# Patient Record
Sex: Male | Born: 1985 | Race: Asian | Hispanic: No | Marital: Married | Smoking: Former smoker
Health system: Southern US, Community
[De-identification: ages and names within clinical notes are randomized; demographics above are authoritative.]

## PROBLEM LIST (undated history)

## (undated) DIAGNOSIS — R42 Dizziness and giddiness: Secondary | ICD-10-CM

## (undated) DIAGNOSIS — G4730 Sleep apnea, unspecified: Secondary | ICD-10-CM

## (undated) DIAGNOSIS — F410 Panic disorder [episodic paroxysmal anxiety] without agoraphobia: Secondary | ICD-10-CM

---

## 2007-01-04 ENCOUNTER — Emergency Department (HOSPITAL_COMMUNITY): Admission: EM | Admit: 2007-01-04 | Discharge: 2007-01-04 | Payer: Self-pay | Admitting: Emergency Medicine

## 2014-12-22 ENCOUNTER — Emergency Department (HOSPITAL_COMMUNITY)
Admission: EM | Admit: 2014-12-22 | Discharge: 2014-12-22 | Disposition: A | Discharge status: Home / Self Care | Payer: 59 | Attending: Emergency Medicine | Admitting: Emergency Medicine

## 2014-12-22 ENCOUNTER — Encounter (HOSPITAL_COMMUNITY): Payer: Self-pay | Admitting: Emergency Medicine

## 2014-12-22 ENCOUNTER — Emergency Department (HOSPITAL_COMMUNITY): Payer: 59

## 2014-12-22 DIAGNOSIS — X58XXXA Exposure to other specified factors, initial encounter: Secondary | ICD-10-CM | POA: Insufficient documentation

## 2014-12-22 DIAGNOSIS — S39012A Strain of muscle, fascia and tendon of lower back, initial encounter: Secondary | ICD-10-CM | POA: Diagnosis not present

## 2014-12-22 DIAGNOSIS — Y9389 Activity, other specified: Secondary | ICD-10-CM | POA: Insufficient documentation

## 2014-12-22 DIAGNOSIS — Y998 Other external cause status: Secondary | ICD-10-CM | POA: Diagnosis not present

## 2014-12-22 DIAGNOSIS — S3992XA Unspecified injury of lower back, initial encounter: Secondary | ICD-10-CM | POA: Diagnosis present

## 2014-12-22 DIAGNOSIS — M549 Dorsalgia, unspecified: Secondary | ICD-10-CM

## 2014-12-22 DIAGNOSIS — Y9289 Other specified places as the place of occurrence of the external cause: Secondary | ICD-10-CM | POA: Insufficient documentation

## 2014-12-22 LAB — URINALYSIS, ROUTINE W REFLEX MICROSCOPIC
BILIRUBIN URINE: NEGATIVE
Glucose, UA: NEGATIVE mg/dL
Hgb urine dipstick: NEGATIVE
Ketones, ur: NEGATIVE mg/dL
Leukocytes, UA: NEGATIVE
Nitrite: NEGATIVE
PROTEIN: NEGATIVE mg/dL
Specific Gravity, Urine: 1.009 (ref 1.005–1.030)
Urobilinogen, UA: 0.2 mg/dL (ref 0.0–1.0)
pH: 7 (ref 5.0–8.0)

## 2014-12-22 MED ORDER — CYCLOBENZAPRINE HCL 10 MG PO TABS: 5.0000 mg | ORAL_TABLET | Freq: Every day | ORAL | Status: DC

## 2014-12-22 MED ORDER — HYDROCODONE-ACETAMINOPHEN 5-325 MG PO TABS
1.0000 | ORAL_TABLET | Freq: Once | ORAL | Status: AC
  Administered 2014-12-22: 1 via ORAL
  Filled 2014-12-22: qty 1

## 2014-12-22 NOTE — ED Provider Notes (Signed)
CSN: [id number]     Arrival date & time 12/22/14  1435 History  This chart was scribed for non-physician practitioner Raymon MuttonMarissa Titiana Severa, PA, working with Tilden FossaElizabeth Rees, MD, by Tanda RockersMargaux Venter, ED Scribe. This patient was seen in room WTR8/WTR8 and the patient's care was started at 3:13 PM.     Chief Complaint  Patient presents with  . Back Pain   The history is provided by the patient. No language interpreter was used.     HPI Comments: Kyle Robertson is a 29 y.o. male brought in by ambulance, with no pertinent past medical history who presents to the Emergency Department complaining of gradual onset, constant, lower back pain that began Saturday, 12/17/2014 (5 days ago), worsening today. Pt mentions that the back pain began shortly after cleaning his house on Saturday. The pain was gradually improving during the week, but was exacerbated today after moving a small chair. Pt states that he was unable to ambulate due to the pain, prompting him to call EMS. The pain is a 10/10 with movement and is about a 5/10 with rest. Pt did take an 800 mg Ibuprofen at approximately 1:15 PM today (2 hours ago) and states that his pain is improving. He does report mild weakness to his right leg which exacerbates the back pain upon movement. Pt denies pain radiation. No recent fall, injury, or trauma to the back. Pt has no hx of back issues. No IV drug abuse or illicit drug use in the past. Denies fever, chills, numbness or tingling to extremities, abdominal pain, nausea, vomiting, diarrhea, urinary or bowel incontinence, recent travels, loss of sensation, chest pain, shortness of breath, difficulty breathing, or any other symptoms.  PCP none   History reviewed. No pertinent past medical history. History reviewed. No pertinent past surgical history. No family history on file. History  Substance Use Topics  . Smoking status: Never Smoker   . Smokeless tobacco: Not on file  . Alcohol Use: Yes    Review of Systems   Constitutional: Negative for fever and chills.  Respiratory: Negative for shortness of breath.   Cardiovascular: Negative for chest pain.  Gastrointestinal: Negative for nausea, vomiting and abdominal pain.       Negative for bowel incontinence.   Genitourinary:       Negative for urinary incontinence.   Musculoskeletal: Positive for back pain (Lumbar pain. ). Negative for arthralgias.  Neurological: Positive for weakness (Right leg weakness. ). Negative for numbness.      Allergies  Sudafed  Home Medications   Prior to Admission medications   Medication Sig Start Date End Date Taking? Authorizing Provider  cyclobenzaprine (FLEXERIL) 10 MG tablet Take 0.5 tablets (5 mg total) by mouth at bedtime. 12/22/14   Carlitos Bottino, PA-C   Triage Vitals: BP 152/75 mmHg  Pulse 65  Temp(Src) 97.6 F (36.4 C) (Oral)  Resp 16  SpO2 100%   Physical Exam  Constitutional: He is oriented to person, place, and time. He appears well-developed and well-nourished. No distress.  HENT:  Head: Normocephalic and atraumatic.  Mouth/Throat: Oropharynx is clear and moist.  Eyes: Conjunctivae and EOM are normal. Pupils are equal, round, and reactive to light. Right eye exhibits no discharge. Left eye exhibits no discharge.  Neck: Normal range of motion. Neck supple. No tracheal deviation present.  Negative pain upon palpation to the c-spine.   Cardiovascular: Normal rate, regular rhythm and normal heart sounds.   Pulses:      Radial pulses are 2+ on the  right side, and 2+ on the left side.       Dorsalis pedis pulses are 2+ on the right side, and 2+ on the left side.  Pulmonary/Chest: Effort normal and breath sounds normal. No respiratory distress. He has no wheezes. He has no rales.  Abdominal: Soft. Bowel sounds are normal. He exhibits no distension. There is no tenderness. There is no rebound, no guarding and no CVA tenderness.  Musculoskeletal: He exhibits tenderness. He exhibits no edema.        Lumbar back: He exhibits tenderness. He exhibits normal range of motion, no bony tenderness, no swelling, no edema, no deformity and no laceration.       Back:  Full ROM to upper and lower extremities without difficulty noted, negative ataxia noted. Mild decreased range of motion to lower extremities secondary to pain in the lumbar spine. Negative deformities identified to the spine-discomfort upon palpation to the spinous and paravertebral regions of the lumbar spine bilaterally.  Lymphadenopathy:    He has no cervical adenopathy.  Neurological: He is alert and oriented to person, place, and time. No cranial nerve deficit. He exhibits normal muscle tone. Coordination normal.  Cranial nerves III-XII grossly intact Strength 5+/5+ to upper and lower extremities bilaterally with resistance applied, equal distribution noted Equal grip strength bilaterally Negative saddle paresthesias bilaterally Sensation intact with differentiation sharp and dull touch Negative arm drift  Skin: Skin is warm and dry. No rash noted. He is not diaphoretic. No erythema.  Psychiatric: He has a normal mood and affect. His behavior is normal. Thought content normal.  Nursing note and vitals reviewed.   ED Course  Procedures (including critical care time)  DIAGNOSTIC STUDIES: Oxygen Saturation is 100% on RA, normal by my interpretation.    COORDINATION OF CARE: 3:22 PM-Discussed treatment plan which includes UA with pt at bedside and pt agreed to plan.   Labs Review Labs Reviewed  URINALYSIS, ROUTINE W REFLEX MICROSCOPIC    Imaging Review Dg Lumbar Spine Complete  12/22/2014   CLINICAL DATA:  Woke up this a.m. with low back pain, right leg pain  EXAM: LUMBAR SPINE - COMPLETE 4+ VIEW  COMPARISON:  None.  FINDINGS: Five views of lumbar spine submitted. No acute fracture or subluxation. Alignment, disc spaces and vertebral body heights are preserved.  IMPRESSION: Negative.   Electronically Signed   By: Natasha Mead M.D.   On: 12/22/2014 16:41     EKG Interpretation None      MDM   Final diagnoses:  Back pain  Lumbar strain, initial encounter    Medications  HYDROcodone-acetaminophen (NORCO/VICODIN) 5-325 MG per tablet 1 tablet (1 tablet Oral Given 12/22/14 1554)    Filed Vitals:   12/22/14 1441 12/22/14 1727  BP: 152/75 150/70  Pulse: 65 65  Temp: 97.6 F (36.4 C)   TempSrc: Oral   Resp: 16 18  SpO2: 100% 99%   I personally performed the services described in this documentation, which was scribed in my presence. The recorded information has been reviewed and is accurate.  Urinalysis unremarkable-negative hemoglobin, nitrites, leukocytes. Lumbar spine negative. Negative focal neurological deficits noted. Pulses palpable and strong. Tenderness upon palpation to lumbar spine, suspicion to be muscular nature secondary to pain upon palpation and pain with motion. Imaging unremarkable. Gait proper-negative step-offs or sway. Negative findings of UTI or pyelonephritis. Negative CVA tenderness. Abdominal exam benign-doubt acute abdominal processes. Pain medications given in ED setting with relief. Doubt cauda equina. Doubt epidural abscess. Suspicion to be  muscular pain/strain of the lumbar spine. Patient stable, afebrile. Patient not septic appearing. Discharged patient. Discharged patient with muscle relaxer-discussed course, precautions, disposal technique. Discussed with patient to avoid any physical or strenuous activity. Discussed with patient to apply heat and massage with icy hot ointment. Discussed with patient to avoid any physical or heavy lifting. [facility] and [facility]opedics. Discussed with patient to closely monitor symptoms and if symptoms are to worsen or change to report back to the ED - strict return instructions given.  Patient agreed to plan of care, understood, all questions answered.   Raymon MuttonMarissa Germaine Ripp, PA-C 12/22/14 1748  Tilden FossaElizabeth Rees,  MD 12/23/14 [phone]

## 2014-12-22 NOTE — ED Notes (Signed)
Per EMS, Pt c/o back pain since this past Saturday. Pt attempted to move a chair a couple of hours ago and pain became severe. Pt took tylenol with no relief. Pt denies original injury on Saturday. A&Ox4.

## 2014-12-22 NOTE — Discharge Instructions (Signed)
Please call your doctor for a followup appointment within 24-48 hours. When you talk to your doctor please let them know that you were seen in the emergency department and have them acquire all of your records so that they can discuss the findings with you and formulate a treatment plan to fully care for your new and ongoing problems. Please follow-up with orthopedics Please rest and stay hydrated Please take medication as prescribed. While on muscle relaxer there is to be no drinking alcohol, driving, operating any machinery. While on muscle relaxers this can lead to drowsiness. Please rest and stay hydrated avoid any physical or strenuous activity Please apply heat and massage with icy hot ointment Please continue to monitor symptoms closely and if symptoms are to worsen or change (fever greater than 101, chills, sweating, nausea, vomiting, chest pain, shortness of breathe, difficulty breathing, weakness, numbness, tingling, worsening or changes to pain pattern, fall, injury, inability to control urine or bowel movements, blood in the urine, pain with urination, loss of sensation to legs, drinking of the feet, weakness to the legs, inability to apply pressure) please report back to the Emergency Department immediately.   Lumbosacral Strain Lumbosacral strain is a strain of any of the parts that make up your lumbosacral vertebrae. Your lumbosacral vertebrae are the bones that make up the lower third of your backbone. Your lumbosacral vertebrae are held together by muscles and tough, fibrous tissue (ligaments).  CAUSES  A sudden blow to your back can cause lumbosacral strain. Also, anything that causes an excessive stretch of the muscles in the low back can cause this strain. This is typically seen when people exert themselves strenuously, fall, lift heavy objects, bend, or crouch repeatedly. RISK FACTORS  Physically demanding work.  Participation in pushing or pulling sports or sports that require a  sudden twist of the back (tennis, golf, baseball).  Weight lifting.  Excessive lower back curvature.  Forward-tilted pelvis.  Weak back or abdominal muscles or both.  Tight hamstrings. SIGNS AND SYMPTOMS  Lumbosacral strain may cause pain in the area of your injury or pain that moves (radiates) down your leg.  DIAGNOSIS Your health care provider can often diagnose lumbosacral strain through a physical exam. In some cases, you may need tests such as X-ray exams.  TREATMENT  Treatment for your lower back injury depends on many factors that your clinician will have to evaluate. However, most treatment will include the use of anti-inflammatory medicines. HOME CARE INSTRUCTIONS   Avoid hard physical activities (tennis, racquetball, waterskiing) if you are not in proper physical condition for it. This may aggravate or create problems.  If you have a back problem, avoid sports requiring sudden body movements. Swimming and walking are generally safer activities.  Maintain good posture.  Maintain a healthy weight.  For acute conditions, you may put ice on the injured area.  Put ice in a plastic bag.  Place a towel between your skin and the bag.  Leave the ice on for 20 minutes, 2-3 times a day.  When the low back starts healing, stretching and strengthening exercises may be recommended. SEEK MEDICAL CARE IF:  Your back pain is getting worse.  You experience severe back pain not relieved with medicines. SEEK IMMEDIATE MEDICAL CARE IF:   You have numbness, tingling, weakness, or problems with the use of your arms or legs.  There is a change in bowel or bladder control.  You have increasing pain in any area of the body, including your belly (  abdomen).  You notice shortness of breath, dizziness, or feel faint.  You feel sick to your stomach (nauseous), are throwing up (vomiting), or become sweaty.  You notice discoloration of your toes or legs, or your feet get very  cold. MAKE SURE YOU:   Understand these instructions.  Will watch your condition.  Will get help right away if you are not doing well or get worse. Document Released: 05/08/2005 Document Revised: 08/03/2013 Document Reviewed: 03/17/2013 [facility] Patient Information 2015 [place], [place]. This information is not intended to replace advice given to you by your health care provider. Make sure you discuss any questions you have with your health care provider.   Emergency Department Resource Guide 1) Find a Doctor and Pay Out of Pocket Although you won't have to find out who is covered by your insurance plan, it is a good idea to ask around and get recommendations. You will then need to call the office and see if the doctor you have chosen will accept you as a new patient and what types of options they offer for patients who are self-pay. Some doctors offer discounts or will set up payment plans for their patients who do not have insurance, but you will need to ask so you aren't surprised when you get to your appointment.  2) Contact Your Local Health Department Not all health departments have doctors that can see patients for sick visits, but many do, so it is worth a call to see if yours does. If you don't know where your local health department is, you can check in your phone book. The CDC also has a tool to help you locate your state's health department, and many state websites also have listings of all of their local health departments.  3) Find a Walk-in Clinic If your illness is not likely to be very severe or complicated, you may want to try a walk in clinic. These are popping up all over the country in pharmacies, drugstores, and shopping centers. They're usually staffed by nurse practitioners or physician assistants that have been trained to treat common illnesses and complaints. They're usually fairly quick and inexpensive. However, if you have serious medical issues or chronic medical  problems, these are probably not your best option.  No Primary Care Doctor: - Call Health Connect at  [phone] - they can help you locate a primary care doctor that  accepts your insurance, provides certain services, etc. - Physician Referral Service- [phone]  Chronic Pain Problems: Organization         Address  Phone   Notes  [facility]  [phone] Patients need to be referred by their primary care doctor.   Medication Assistance: Organization         Address  Phone   Notes  [facility] [address] [place], [place] [address] [phone] --Must be a resident of [facility] -- Must have NO insurance coverage whatsoever (no Medicaid/ Medicare, etc.) -- The pt. MUST have a primary care doctor that directs their care regularly and follows them in the community   [facility]  [phone]   [employer]  [phone]    Agencies that provide inexpensive medical care: Organization         Address  Phone   Notes  [facility]  [phone]   [facility]    [phone]   [facility] [address]  [address] [place], [place] [address] [phone]   [facility] [address] [place] [place] [phone]   Planned Parenthood    [phone]   [facility]    [phone]   [facility]  [address], [place] Phone:  [phone], Fax:  [phone] Hours of Operation:  9 am - 6 pm, M-F.  Also accepts Medicaid/Medicare and self-pay.  [facility]  [address], [place] Phone: [phone], Fax: [phone]. Hours of Operation:  8:30 am - 5:30 pm, M-F.  Also accepts Medicaid and self-pay.  [facility] [place] [address] [place] Phone: [phone]   [facility] [address] Natasha Bence [place], [place] [phone], Ext. [phone]  Mondays & Thursdays: 7-9 AM.  First 15 patients are seen on a first come, first serve basis.    Medicaid-accepting [facility] Providers:  Organization         Address  Phone   Notes  [facility] [address], [place] [phone] Also accepts self-pay patients.  [facility] [address] Laurell Josephs [place], [place]  [phone]   [facility] [address], [place] [phone]   [facility] [address] [place] [phone]   Renaye Rakers [address], [place]   [phone] Only accepts [place] patients after they have their name applied to their card.   Self-Pay (no insurance) in [facility]:  Organization         Address  Phone   Notes  Sickle Cell Patients, [facility] Internal Medicine [address] [place] [address] [place], [place] [phone]   [facility] [address] [place], [place] [phone]   [facility] [place]  [address], [place] [phone]   [facility]/Dr. Osei-Bonsu  [address], [place] or [address], [place] [phone] Phone number for both [place] and [place] locations is the same.  [facility]gent Medical and [facility] [address] [place] [phone]   [facility] [address], [place] or [address] [phone]   [facility] [address] [place] [phone], phone; [phone], fax Sees patients 1st and 3rd Saturday of every month.  Must not qualify for public or private insurance (i.e. Medicaid, Medicare, [place] Health Choice, Veterans' Benefits)  Household income should be no more than 200% of the poverty level The clinic cannot treat you if you are pregnant or think you are pregnant  Sexually transmitted diseases are not treated at  the clinic.    Dental Care: Organization         Address  Phone  Notes  [facility] Department of [facility] [address] [place], [place] [phone] Accepts children up to age 52 who are enrolled in [place] or [place] Health Choice; pregnant women with a Medicaid card; and children who have applied for Medicaid or [place] Health Choice, but were declined, whose parents can pay a reduced fee at time of service.  [facility] Department of [facility]  [address], [place] [phone] Accepts children up to age 59 who are enrolled in [place] or [place] Health  Choice; pregnant women with a Medicaid card; and children who have applied for Medicaid or [place] Health Choice, but were declined, whose parents can pay a reduced fee at time of service.  [facility]  [address], [place] [phone]) [phone] Patients are seen by appointment only. Walk-ins are not accepted. [facility] will see patients 29 years of age and older. Monday - Tuesday (8am-5pm) Most Wednesdays (8:30-5pm) $30 per visit, cash only  [facility]  [address], [facility] [place] [place] [phone] Patients are seen by appointment only. Walk-ins are not accepted. [facility] will see patients 29 years of age and older. One Wednesday Evening (Monthly: Volunteer Based).  $30 per visit, cash only  [employer] [facility] [facility]  [phone] for adults; Children under age 614, call Graduate Pediatric Dentistry at [phone]. Children aged 214-14, please call [phone] to request a pediatric application.  Dental services are provided in all areas of dental care including fillings, crowns and bridges, complete and partial dentures, implants, gum treatment, root canals, and extractions. Preventive care is also provided. Treatment is provided to both adults and children. Patients are selected via a lottery and there is often a  waiting list.   [facility] [address], [place]  [phone] [email or web address]   [facility] [address], [place], [place] [phone], Ext. [phone] Second and Fourth Thursday of each month, opens at 6:30 AM; Clinic ends at 9 AM.  Patients are seen on a first-come first-served basis, and a limited number are seen during each clinic.   [facility]  [address], [place], [place] [phone]   Eligibility Requirements You must have lived in [place], or [place] for at least the last three months.   You cannot be eligible for state or federal sponsored [place] Cityhealthcare insurance, including CIGNAVeterans Administration, [place]icaid, or [employer]icare.   You generally cannot be eligible for healthcare insurance through your employer.    How to apply: Eligibility screenings are held every Tuesday and Wednesday afternoon from 1:00 pm until 4:00 pm. You do not need an appointment for the interview!  [facility] [address], [place], [place] [phone]   [facility]  [phone]   [facility]  [phone]   [facility]  [phone]    Behavioral Health Resources in the Community: Intensive Outpatient Programs Organization         Address  Phone  Notes  [facility] [address], [place], [place] [phone]   [facility] [address], [place], [place] [phone]   [facility] [address], [place], [place]  [phone]   [facility] [address],  [place], [place] [phone] or [phone]   Substance Abuse Resources Organization         Address  Phone  Notes  [facility]  [phone]   [facility]  [phone]   [facility]  [phone]   [facility]  [phone]   Residential & Outpatient Substance Abuse  Program  [phone]   Psychological Services Organization         Address  Phone  Notes  [facility]  [phone]- [phone]   [facility]  [phone]- [phone]   [facility] [address], [place] [phone] or [phone]  Mobile Crisis Teams Organization         Address  Phone  Notes  [facility], Mobile Crisis Care Unit  [phone]   [facility]  [address] [place], [place] [phone]   Doristine LocksSharon DeEsch [address] [place] [phone]    Self-Help/Support Groups Organization         Address  Phone             Notes  [facility] [place] - variety of support groups  [phone]- [phone] Call for more information  Narcotics Anonymous (NA), [facility] [address], [place]  2 meetings at this location   Statisticianesidential Treatment Programs Organization         Address  Phone  Notes  [facility] [address],    [place]  [phone]   [facility]  [address], [place] [phone], [place], [place] [phone]   [facility] [address], [place] [phone] Admissions: 8am-3pm M-F  [facility] [address]    [place], [place] [phone]   [facility] [address], [place], [place] [phone]   [facility] [address]  [place], [place] [phone]   [facility] - Intensive Outpatient [address], [place], [place] [phone]   [facility] ([facility] [address]  [place], [place] [phone] or [phone]   [facility] ([facility]) [address] [place], [place] [phone] Accepts Medicaid  [facility] [address]  [place] [phone] Substance Abuse/Addiction Treatment   [facility] Resources Organization          Address  Phone  Notes  [facility]  [phone]) [phone]   Angie FavaJulie Brannon, PhD [address], [address] [place], [place]   [phone](336) [phone] or [phone]   [facility]   [address] [place], [place] [phone](336) [phone]   [facility] [address], [place], [place] [phone]) [phone] Insurance/Medicaid/sponsorship through [facility]  [facility] Families [address]                                    [place], [place] [phone](336) [phone] Therapy/tele-psych/case  [facility] [address].   [place], [place] [phone]) [phone]    Dr. Lolly MustacheArfeen  [phone]   [facility]  [employer] [facility] 1) [address], [place] 2) [address], [place] 3)  [address], [place] [phone]) [phone]) [phone]  [phone](336) [phone]   [facility] Child Abuse Hotline [phone](336) [phone] or [phone]) [phone] (After Hours)

## 2014-12-22 NOTE — ED Notes (Signed)
Pt ambulated out into hallway in back without assistance. Pt still having spasms but capable of ambulation.

## 2014-12-22 NOTE — ED Notes (Signed)
Pt sts that last Saturday he was cleaning and about an hur late rhis lower back began to hurt. Pt sts the pain had gotten better until today when he moved a chair. Pt denies numbness or tingling. Pt denies loss of bladder or bowels.

## 2015-11-04 ENCOUNTER — Emergency Department (HOSPITAL_COMMUNITY): Payer: Medicaid Other

## 2015-11-04 ENCOUNTER — Emergency Department (HOSPITAL_COMMUNITY)
Admission: EM | Admit: 2015-11-04 | Discharge: 2015-11-04 | Disposition: A | Discharge status: Home / Self Care | Payer: Medicaid Other | Attending: Emergency Medicine | Admitting: Emergency Medicine

## 2015-11-04 ENCOUNTER — Other Ambulatory Visit: Payer: Self-pay

## 2015-11-04 ENCOUNTER — Encounter (HOSPITAL_COMMUNITY): Payer: Self-pay

## 2015-11-04 DIAGNOSIS — R0600 Dyspnea, unspecified: Secondary | ICD-10-CM | POA: Diagnosis not present

## 2015-11-04 DIAGNOSIS — R079 Chest pain, unspecified: Secondary | ICD-10-CM | POA: Diagnosis present

## 2015-11-04 LAB — CBC
HEMATOCRIT: 43.4 % (ref 39.0–52.0)
HEMOGLOBIN: 14.8 g/dL (ref 13.0–17.0)
MCH: 28.7 pg (ref 26.0–34.0)
MCHC: 34.1 g/dL (ref 30.0–36.0)
MCV: 84.3 fL (ref 78.0–100.0)
Platelets: 269 10*3/uL (ref 150–400)
RBC: 5.15 MIL/uL (ref 4.22–5.81)
RDW: 12.3 % (ref 11.5–15.5)
WBC: 7 10*3/uL (ref 4.0–10.5)

## 2015-11-04 LAB — BASIC METABOLIC PANEL
ANION GAP: 10 (ref 5–15)
BUN: 10 mg/dL (ref 6–20)
CHLORIDE: 108 mmol/L (ref 101–111)
CO2: 21 mmol/L — ABNORMAL LOW (ref 22–32)
Calcium: 8.8 mg/dL — ABNORMAL LOW (ref 8.9–10.3)
Creatinine, Ser: 0.87 mg/dL (ref 0.61–1.24)
Glucose, Bld: 122 mg/dL — ABNORMAL HIGH (ref 65–99)
POTASSIUM: 4 mmol/L (ref 3.5–5.1)
SODIUM: 139 mmol/L (ref 135–145)

## 2015-11-04 LAB — I-STAT TROPONIN, ED: Troponin i, poc: 0 ng/mL (ref 0.00–0.08)

## 2015-11-04 MED ORDER — PREDNISONE 20 MG PO TABS: 40.0000 mg | ORAL_TABLET | Freq: Every day | ORAL | Status: DC

## 2015-11-04 MED ORDER — ALBUTEROL SULFATE HFA 108 (90 BASE) MCG/ACT IN AERS
2.0000 | INHALATION_SPRAY | RESPIRATORY_TRACT | Status: DC | PRN
  Administered 2015-11-04: 2 via RESPIRATORY_TRACT
  Filled 2015-11-04: qty 6.7

## 2015-11-04 MED ORDER — ASPIRIN 81 MG PO CHEW
324.0000 mg | CHEWABLE_TABLET | Freq: Once | ORAL | Status: AC
  Administered 2015-11-04: 324 mg via ORAL
  Filled 2015-11-04: qty 4

## 2015-11-04 MED ORDER — IPRATROPIUM-ALBUTEROL 0.5-2.5 (3) MG/3ML IN SOLN
3.0000 mL | Freq: Once | RESPIRATORY_TRACT | Status: AC
  Administered 2015-11-04: 3 mL via RESPIRATORY_TRACT
  Filled 2015-11-04: qty 3

## 2015-11-04 MED ORDER — PREDNISONE 20 MG PO TABS
60.0000 mg | ORAL_TABLET | Freq: Once | ORAL | Status: AC
  Administered 2015-11-04: 60 mg via ORAL
  Filled 2015-11-04: qty 3

## 2015-11-04 NOTE — Discharge Instructions (Signed)

## 2015-11-04 NOTE — ED Provider Notes (Signed)
CSN: [id number]     Arrival date & time 11/04/15  [id number] History   First MD Initiated Contact with Patient 11/04/15 [phone]     Chief Complaint  Patient presents with  . Chest Pain     (Consider location/radiation/quality/duration/timing/severity/associated sxs/prior Treatment) HPI   Kyle Robertson is a 30 year old male with history of intermittent back pain, otherwise healthy, presents to the emergency department for left sided sharp and tight chest pain associated with dyspnea that began last night and has been persistent since. He states that these pain and dyspnea both worsened with laying flat and with exertion.  Pain is located and has a left lower sternal border and anterior chest, described as tightness and rated 6 out of 10.  His pain is improved with sitting up straight and extending his neck out.  He denies wheeze, diaphoresis, nausea, vomiting, lightheadedness, palpitations, lower extremity edema. He states that 3 days ago he had flulike symptoms with nasal congestion, cough, sore throat, fatigue, chills and fever. 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  Last night when he laid down is when he began to feel short of breath, and was worse this morning when he woke up.   History reviewed. No pertinent past medical history. History reviewed. No pertinent past surgical history. No family history on file. Social History  Substance Use Topics  . Smoking status: Never Smoker   . Smokeless tobacco: None  . Alcohol Use: Yes    Review of Systems  All other systems reviewed and are negative.     Allergies  Sudafed  Home Medications   Prior to Admission medications   Medication Sig Start Date End Date Taking? Authorizing Provider  cyclobenzaprine (FLEXERIL) 10 MG tablet Take 0.5 tablets (5 mg total) by mouth at bedtime. Patient not taking: Reported on 11/04/2015 12/22/14   Marissa Sciacca, PA-C  predniSONE  (DELTASONE) 20 MG tablet Take 2 tablets (40 mg total) by mouth daily. Take 40 mg by mouth daily for 3 days, then 20mg  by mouth daily for 3 days, then 10mg  daily for 3 days 11/04/15   Danelle BerryLeisa Dacia Capers, PA-C   BP 108/88 mmHg  Pulse 72  Temp(Src) 97.9 F (36.6 C) (Oral)  Resp 15  Ht 5\' 6"  (1.676 m)  Wt 90.464 kg  BMI 32.21 kg/m2  SpO2 99% Physical Exam  Constitutional: He is oriented to person, place, and time. He appears well-developed and well-nourished. No distress.  HENT:  Head: Normocephalic and atraumatic.  Nose: Nose normal.  Mouth/Throat: Oropharynx is clear and moist. No oropharyngeal exudate.  Eyes: Conjunctivae and EOM are normal. Pupils are equal, round, and reactive to light. Right eye exhibits no discharge. Left eye exhibits no discharge. No scleral icterus.  Neck: Normal range of motion. Neck supple. No JVD present. No tracheal deviation present. No thyromegaly present.  Cardiovascular: Normal rate, regular rhythm, normal heart sounds and intact distal pulses.  Exam reveals no gallop and no friction rub.   No murmur heard. Regular rate and rhythm, no murmur, gallop or rub. Symmetrical radial pulses 2+ and dorsal pedis pulses 2+, no lower extremity edema.    Pulmonary/Chest: Breath sounds normal. No respiratory distress. He has no wheezes. He has no rales. He exhibits no tenderness.  CTA A&P, pt sitting erect with neck extended, with mild tachypnea, no retractions  Abdominal: Soft. Bowel sounds are normal. He exhibits no distension and no mass. There is no  tenderness. There is no rebound and no guarding.  Musculoskeletal: Normal range of motion. He exhibits no edema or tenderness.  Lymphadenopathy:    He has no cervical adenopathy.  Neurological: He is alert and oriented to person, place, and time. He has normal reflexes. No cranial nerve deficit. He exhibits normal muscle tone. Coordination normal.  Skin: Skin is warm and dry. No rash noted. He is not diaphoretic. No erythema. No  pallor.  Psychiatric: He has a normal mood and affect. His behavior is normal. Judgment and thought content normal.  Nursing note and vitals reviewed.   ED Course  Procedures (including critical care time) Labs Review Labs Reviewed  BASIC METABOLIC PANEL - Abnormal; Notable for the following:    CO2 21 (*)    Glucose, Bld 122 (*)    Calcium 8.8 (*)    All other components within normal limits  CBC  I-STAT TROPOININ, ED    Imaging Review Dg Chest 2 View  11/04/2015  CLINICAL DATA:  30 year old male with left-sided chest tightness and shortness of breath EXAM: CHEST  2 VIEW COMPARISON:  None. FINDINGS: The lungs are clear and negative for focal airspace consolidation, pulmonary edema or suspicious pulmonary nodule. No pleural effusion or pneumothorax. Cardiac and mediastinal contours are within normal limits. No acute fracture or lytic or blastic osseous lesions. The visualized upper abdominal bowel gas pattern is unremarkable. IMPRESSION: Normal chest x-ray. Electronically Signed   By: Malachy Moan M.D.   On: 11/04/2015 09:45   I have personally reviewed and evaluated these images and lab results as part of my medical decision-making.   EKG Interpretation   Date/Time:  Saturday November 04 2015 08:24:02 EDT Ventricular Rate:  82 PR Interval:  134 QRS Duration: 94 QT Interval:  372 QTC Calculation: 434 R Axis:   87 Text Interpretation:  Normal sinus rhythm ST elevation, consider early  repolarization Borderline ECG since last tracing no significant change  Confirmed by BELFI  MD, MELANIE ([id number]) on 11/04/2015 8:38:01 AM      MDM   30 year old male with chest pain and shortness of breath, recent URI/flulike symptoms He complained of exertional dyspnea and orthopnea with chest tightness and chest pain Is otherwise healthy, chest pain nonconcerning for ACS, Heart score of 1 Do not suspect PE, he is PERC negative With recent infection and positional chest pain I did consider  pericarditis or myocarditis, chest x-ray, EKG and troponin obtained, negative and reassuring  Patient also had basic blood work obtained which was unremarkable He noted significant improvement in his dyspnea with the DuoNeb, he appears more comfortable and his respiratory rate has improved.  He ambulated with normal heart rate and oxygenation without difficulty and without any dyspnea. Will discharge home with albuterol and short steroid taper.  I discussed return precautions with him. Work note was given.  I feel he is safe to discharge home and he agrees.  He was discharged in good condition, VSS.   Final diagnoses:  Dyspnea        Danelle Berry, PA-C 11/06/15 1610  Rolan Bucco, MD 11/11/15 [phone]

## 2015-11-04 NOTE — ED Notes (Signed)
Pt verbalized understanding of d/c instructions, prescriptions, and follow-up care. No further questions/concerns, VSS, ambulatory w/ steady gait (refused wheelchair) 

## 2015-11-04 NOTE — ED Notes (Signed)
Pt. Reports having flu-like symptoms ago, sore throat , fatique,  Chills and productive cough.  Pt. Also has nasal congestion.  He reports that the symptoms have  Cleared up but he has lt. Side chest tightness.  Non -radiating.  Denies any n/v/d.  ECG completed in Triage.

## 2015-11-04 NOTE — ED Notes (Signed)
Pt transported to xray 

## 2015-11-04 NOTE — ED Notes (Signed)
Pt SpO2 100% and hr 70bpm while ambulating in hallway.

## 2015-11-06 ENCOUNTER — Encounter (HOSPITAL_COMMUNITY): Payer: Self-pay | Admitting: Emergency Medicine

## 2015-11-06 ENCOUNTER — Emergency Department (HOSPITAL_COMMUNITY): Payer: Medicaid Other

## 2015-11-06 ENCOUNTER — Emergency Department (HOSPITAL_COMMUNITY)
Admission: EM | Admit: 2015-11-06 | Discharge: 2015-11-06 | Disposition: A | Discharge status: Home / Self Care | Payer: Medicaid Other | Attending: Emergency Medicine | Admitting: Emergency Medicine

## 2015-11-06 DIAGNOSIS — R51 Headache: Secondary | ICD-10-CM | POA: Diagnosis present

## 2015-11-06 DIAGNOSIS — R1110 Vomiting, unspecified: Secondary | ICD-10-CM | POA: Insufficient documentation

## 2015-11-06 DIAGNOSIS — F17200 Nicotine dependence, unspecified, uncomplicated: Secondary | ICD-10-CM | POA: Diagnosis not present

## 2015-11-06 DIAGNOSIS — R69 Illness, unspecified: Secondary | ICD-10-CM

## 2015-11-06 DIAGNOSIS — J0110 Acute frontal sinusitis, unspecified: Secondary | ICD-10-CM

## 2015-11-06 DIAGNOSIS — J111 Influenza due to unidentified influenza virus with other respiratory manifestations: Secondary | ICD-10-CM | POA: Diagnosis not present

## 2015-11-06 LAB — URINALYSIS, ROUTINE W REFLEX MICROSCOPIC
Bilirubin Urine: NEGATIVE
Glucose, UA: NEGATIVE mg/dL
Hgb urine dipstick: NEGATIVE
Ketones, ur: NEGATIVE mg/dL
LEUKOCYTES UA: NEGATIVE
Nitrite: NEGATIVE
PROTEIN: 30 mg/dL — AB
Specific Gravity, Urine: 1.034 — ABNORMAL HIGH (ref 1.005–1.030)
pH: 8 (ref 5.0–8.0)

## 2015-11-06 LAB — COMPREHENSIVE METABOLIC PANEL
ALK PHOS: 66 U/L (ref 38–126)
ALT: 67 U/L — AB (ref 17–63)
AST: 29 U/L (ref 15–41)
Albumin: 4.1 g/dL (ref 3.5–5.0)
Anion gap: 9 (ref 5–15)
BILIRUBIN TOTAL: 0.4 mg/dL (ref 0.3–1.2)
BUN: 9 mg/dL (ref 6–20)
CALCIUM: 9 mg/dL (ref 8.9–10.3)
CO2: 23 mmol/L (ref 22–32)
CREATININE: 0.78 mg/dL (ref 0.61–1.24)
Chloride: 106 mmol/L (ref 101–111)
GFR calc Af Amer: >60 mL/min (ref >60)
Glucose, Bld: 123 mg/dL — ABNORMAL HIGH (ref 65–99)
Potassium: 3.8 mmol/L (ref 3.5–5.1)
Sodium: 138 mmol/L (ref 135–145)
Total Protein: 7.7 g/dL (ref 6.5–8.1)

## 2015-11-06 LAB — CBC
HCT: 44.9 % (ref 39.0–52.0)
Hemoglobin: 15.1 g/dL (ref 13.0–17.0)
MCH: 28.7 pg (ref 26.0–34.0)
MCHC: 33.6 g/dL (ref 30.0–36.0)
MCV: 85.4 fL (ref 78.0–100.0)
PLATELETS: 323 10*3/uL (ref 150–400)
RBC: 5.26 MIL/uL (ref 4.22–5.81)
RDW: 12.6 % (ref 11.5–15.5)
WBC: 12.4 10*3/uL — AB (ref 4.0–10.5)

## 2015-11-06 LAB — URINE MICROSCOPIC-ADD ON: RBC / HPF: NONE SEEN RBC/hpf (ref 0–5)

## 2015-11-06 LAB — LIPASE, BLOOD: Lipase: 49 U/L (ref 11–51)

## 2015-11-06 MED ORDER — DIPHENHYDRAMINE HCL 50 MG/ML IJ SOLN
25.0000 mg | Freq: Once | INTRAMUSCULAR | Status: AC
  Administered 2015-11-06: 25 mg via INTRAVENOUS
  Filled 2015-11-06: qty 1

## 2015-11-06 MED ORDER — KETOROLAC TROMETHAMINE 30 MG/ML IJ SOLN
30.0000 mg | Freq: Once | INTRAMUSCULAR | Status: AC
  Administered 2015-11-06: 30 mg via INTRAVENOUS
  Filled 2015-11-06: qty 1

## 2015-11-06 MED ORDER — PROCHLORPERAZINE EDISYLATE 5 MG/ML IJ SOLN
10.0000 mg | Freq: Once | INTRAMUSCULAR | Status: AC
  Administered 2015-11-06: 10 mg via INTRAVENOUS
  Filled 2015-11-06: qty 2

## 2015-11-06 MED ORDER — ACETAMINOPHEN 500 MG PO TABS
1000.0000 mg | ORAL_TABLET | Freq: Once | ORAL | Status: AC
  Administered 2015-11-06: 1000 mg via ORAL
  Filled 2015-11-06: qty 2

## 2015-11-06 MED ORDER — SODIUM CHLORIDE 0.9 % IV BOLUS (SEPSIS)
1000.0000 mL | Freq: Once | INTRAVENOUS | Status: AC
Start: 2015-11-06 — End: 2015-11-06
  Administered 2015-11-06: 1000 mL via INTRAVENOUS

## 2015-11-06 MED ORDER — AMOXICILLIN-POT CLAVULANATE 875-125 MG PO TABS: 1.0000 | ORAL_TABLET | Freq: Two times a day (BID) | ORAL | Status: DC

## 2015-11-06 MED ORDER — SODIUM CHLORIDE 0.9 % IV BOLUS (SEPSIS)
1000.0000 mL | Freq: Once | INTRAVENOUS | Status: AC
  Administered 2015-11-06: 1000 mL via INTRAVENOUS

## 2015-11-06 NOTE — ED Provider Notes (Signed)
CSN: [id number]     Arrival date & time 11/06/15  1651 History   First MD Initiated Contact with Patient 11/06/15 1925     Chief Complaint  Patient presents with  . Headache  . Emesis     (Consider location/radiation/quality/duration/timing/severity/associated sxs/prior Treatment) Patient is a 30 y.o. male presenting with headaches and vomiting. The history is provided by the patient.  Headache Pain location:  Frontal Quality:  Sharp Radiates to:  Does not radiate Severity currently:  10/10 Severity at highest:  10/10 Onset quality:  Gradual Duration:  2 days Timing:  Constant Progression:  Worsening Chronicity:  New Similar to prior headaches: no   Relieved by:  Nothing Worsened by:  Nothing Ineffective treatments:  None tried Associated symptoms: no abdominal pain, no congestion, no diarrhea, no fever, no myalgias and no vomiting   Emesis Associated symptoms: no abdominal pain, no arthralgias, no chills, no diarrhea, no headaches and no myalgias    30 yo M With a chief complaint of headache and fever. Patient has had cough congestion myalgias going on for the past for 5 days. Over the past couple days he started having worsening headache with the cough as well as a feeling of sinus congestion. Pain is mostly behind his eyeballs and around his eyes. Some pain with eye movement. Denies any neck pain or neck stiffness. Has had decreased oral intake at home. Continues to have fevers.  History reviewed. No pertinent past medical history. History reviewed. No pertinent past surgical history. History reviewed. No pertinent family history. Social History  Substance Use Topics  . Smoking status: Current Every Day Smoker  . Smokeless tobacco: None  . Alcohol Use: Yes    Review of Systems  Constitutional: Negative for fever and chills.  HENT: Negative for congestion and facial swelling.   Eyes: Negative for discharge and visual disturbance.  Respiratory: Negative for shortness  of breath.   Cardiovascular: Negative for chest pain and palpitations.  Gastrointestinal: Negative for vomiting, abdominal pain and diarrhea.  Musculoskeletal: Negative for myalgias and arthralgias.  Skin: Negative for color change and rash.  Neurological: Negative for tremors, syncope and headaches.  Psychiatric/Behavioral: Negative for confusion and dysphoric mood.      Allergies  Sudafed  Home Medications   Prior to Admission medications   Medication Sig Start Date End Date Taking? Authorizing Provider  cyclobenzaprine (FLEXERIL) 10 MG tablet Take 0.5 tablets (5 mg total) by mouth at bedtime. Patient not taking: Reported on 11/04/2015 12/22/14   Marissa Sciacca, PA-C  predniSONE (DELTASONE) 20 MG tablet Take 2 tablets (40 mg total) by mouth daily. Take 40 mg by mouth daily for 3 days, then  by mouth daily for 3 days, then  daily for 3 days 11/04/15   Danelle Berry, PA-C   BP 147/74 mmHg  Pulse 66  Temp(Src) 98.8 F (37.1 C) (Oral)  Resp 22  SpO2 97% Physical Exam  Constitutional: He is oriented to person, place, and time. He appears well-developed and well-nourished.  HENT:  Head: Normocephalic and atraumatic.  Swollen turbinates, posterior nasal drip, significant sinus tenderness palpation worst in the left frontal. tm normal bilaterally.    Eyes: EOM are normal. Pupils are equal, round, and reactive to light.  Neck: Normal range of motion. Neck supple. No JVD present.  Cardiovascular: Normal rate and regular rhythm.  Exam reveals no gallop and no friction rub.   No murmur heard. Pulmonary/Chest: No respiratory distress. He has no wheezes. He has no rales.  Abdominal:  He exhibits no distension. There is no rebound and no guarding.  Musculoskeletal: Normal range of motion.  Neurological: He is alert and oriented to person, place, and time.  Grossly neurologically intact. No focal neurologic findings. No meningeal signs.  Skin: No rash noted. No pallor.  Psychiatric:  He has a normal mood and affect. His behavior is normal.  Nursing note and vitals reviewed.   ED Course  Procedures (including critical care time) Labs Review Labs Reviewed  COMPREHENSIVE METABOLIC PANEL - Abnormal; Notable for the following:    Glucose, Bld 123 (*)    ALT 67 (*)    All other components within normal limits  CBC - Abnormal; Notable for the following:    WBC 12.4 (*)    All other components within normal limits  LIPASE, BLOOD  URINALYSIS, ROUTINE W REFLEX MICROSCOPIC (NOT AT [facility])    Imaging Review No results found. I have personally reviewed and evaluated these images and lab results as part of my medical decision-making.   EKG Interpretation None      MDM   Final diagnoses:  Influenza-like illness  Acute frontal sinusitis, recurrence not specified    30 yo M with a chief complaint of an influenza-like illness. Patient having a headache that is amongst the frontal sinuses and also has focal sinus tenderness. Suspect that this is sinusitis. Patient was sent from urgent care with concern for meningitis. Discussed with the patient that meningitis is unlikely without any neck stiffness or other meningeal signs however off and an LP if that is what he is concerned about. Patient declining at this time. Feeling better post migraine cocktail and fluids.  D/c home.   10:57 PM:  I have discussed the diagnosis/risks/treatment options with the patient and family and believe the pt to be eligible for discharge home to follow-up with PCP. We also discussed returning to the ED immediately if new or worsening sx occur. We discussed the sx which are most concerning (e.g., sudden worsening pain, fever, inability to tolerate by mouth) that necessitate immediate return. Medications administered to the patient during their visit and any new prescriptions provided to the patient are listed below.  Medications given during this visit Medications  sodium chloride 0.9 % bolus 1,000  mL (0 mLs Intravenous Stopped 11/06/15 2224)  ketorolac (TORADOL) 30 MG/ML injection 30 mg (30 mg Intravenous Given 11/06/15 2040)  acetaminophen (TYLENOL) tablet 1,000 mg (1,000 mg Oral Given 11/06/15 2042)  prochlorperazine (COMPAZINE) injection 10 mg (10 mg Intravenous Given 11/06/15 2150)  diphenhydrAMINE (BENADRYL) injection 25 mg (25 mg Intravenous Given 11/06/15 2150)  sodium chloride 0.9 % bolus 1,000 mL (1,000 mLs Intravenous New Bag/Given 11/06/15 2150)    New Prescriptions   AMOXICILLIN-CLAVULANATE (AUGMENTIN) 875-125 MG TABLET    Take 1 tablet by mouth 2 (two) times daily.    The patient appears reasonably screen and/or stabilized for discharge and I doubt any other medical condition or other [facility] requiring further screening, evaluation, or treatment in the ED at this time prior to discharge.      Melene Planan Carie Kapuscinski, DO 11/06/15 2258

## 2015-11-06 NOTE — Discharge Instructions (Signed)
Take tylenol 2 pills 4 times a day and motrin 4 pills 3 times a day.  Drink plenty of fluids.  Return for worsening shortness of breath, headache, confusion. Follow up with your family doctor.  ° °Influenza, Adult °Influenza ("the flu") is a viral infection of the respiratory tract. It occurs more often in winter months because people spend more time in close contact with one another. Influenza can make you feel very sick. Influenza easily spreads from person to person (contagious). °CAUSES  °Influenza is caused by a virus that infects the respiratory tract. You can catch the virus by breathing in droplets from an infected person's cough or sneeze. You can also catch the virus by touching something that was recently contaminated with the virus and then touching your mouth, nose, or eyes. °RISKS AND COMPLICATIONS °You may be at risk for a more severe case of influenza if you smoke cigarettes, have diabetes, have chronic heart disease (such as heart failure) or lung disease (such as asthma), or if you have a weakened immune system. Elderly people and pregnant women are also at risk for more serious infections. The most common problem of influenza is a lung infection (pneumonia). Sometimes, this problem can require emergency medical care and may be life threatening. °SIGNS AND SYMPTOMS  °Symptoms typically last 4 to 10 days and may include: °· Fever. °· Chills. °· Headache, body aches, and muscle aches. °· Sore throat. °· Chest discomfort and cough. °· Poor appetite. °· Weakness or feeling tired. °· Dizziness. °· Nausea or vomiting. °DIAGNOSIS  °Diagnosis of influenza is often made based on your history and a physical exam. A nose or throat swab test can be done to confirm the diagnosis. °TREATMENT  °In mild cases, influenza goes away on its own. Treatment is directed at relieving symptoms. For more severe cases, your health care provider may prescribe antiviral medicines to shorten the sickness. Antibiotic medicines  are not effective because the infection is caused by a virus, not by bacteria. °HOME CARE INSTRUCTIONS °· Take medicines only as directed by your health care provider. °· Use a cool mist humidifier to make breathing easier. °· Get plenty of rest until your temperature returns to normal. This usually takes 3 to 4 days. °· Drink enough fluid to keep your urine clear or pale yellow. °· Cover your mouth and nose when coughing or sneezing, and wash your hands well to prevent the virus from spreading. °· Stay home from work or school until the fever is gone for at least 1 full day. °PREVENTION  °An annual influenza vaccination (flu shot) is the best way to avoid getting influenza. An annual flu shot is now routinely recommended for all adults in the [place] °SEEK MEDICAL CARE IF: °· You experience chest pain, your cough worsens, or you produce more mucus. °· You have nausea, vomiting, or diarrhea. °· Your fever returns or gets worse. °SEEK IMMEDIATE MEDICAL CARE IF: °· You have trouble breathing, you become short of breath, or your skin or nails become bluish. °· You have severe pain or stiffness in the neck. °· You develop a sudden headache, or pain in the face or ear. °· You have nausea or vomiting that you cannot control. °MAKE SURE YOU:  °· Understand these instructions. °· Will watch your condition. °· Will get help right away if you are not doing well or get worse. °  °This information is not intended to replace advice given to you by your health care provider. Make sure you discuss any questions you have with your health care provider. °  °Document Released: 07/26/2000 Document Revised: 08/19/2014 Document Reviewed: 10/28/2011 °[employer]vier Interactive Patient Education ©  2016 [employer] ° °

## 2015-11-06 NOTE — ED Notes (Signed)
Pt ambulated to restroom. 

## 2015-11-06 NOTE — ED Notes (Signed)
Pt states [facility] and said that they told him to come here due to "the worst headache I have ever had, they said that you guys would do more for me than they could".

## 2015-11-06 NOTE — ED Notes (Signed)
Pt sts HA and vomiting today; pt sts hx of recent URI sx; pt sts using meds that helped breathing

## 2017-08-01 ENCOUNTER — Encounter (HOSPITAL_BASED_OUTPATIENT_CLINIC_OR_DEPARTMENT_OTHER): Payer: Self-pay

## 2017-08-01 ENCOUNTER — Other Ambulatory Visit: Payer: Self-pay

## 2017-08-01 ENCOUNTER — Emergency Department (HOSPITAL_BASED_OUTPATIENT_CLINIC_OR_DEPARTMENT_OTHER)
Admission: EM | Admit: 2017-08-01 | Discharge: 2017-08-01 | Disposition: A | Discharge status: Home / Self Care | Payer: Medicaid Other | Attending: Emergency Medicine | Admitting: Emergency Medicine

## 2017-08-01 ENCOUNTER — Emergency Department (HOSPITAL_BASED_OUTPATIENT_CLINIC_OR_DEPARTMENT_OTHER): Payer: Medicaid Other

## 2017-08-01 DIAGNOSIS — Z87891 Personal history of nicotine dependence: Secondary | ICD-10-CM | POA: Insufficient documentation

## 2017-08-01 DIAGNOSIS — R42 Dizziness and giddiness: Secondary | ICD-10-CM

## 2017-08-01 HISTORY — DX: Sleep apnea, unspecified: G47.30

## 2017-08-01 LAB — BASIC METABOLIC PANEL
ANION GAP: 9 (ref 5–15)
BUN: 11 mg/dL (ref 6–20)
CALCIUM: 9.1 mg/dL (ref 8.9–10.3)
CO2: 26 mmol/L (ref 22–32)
CREATININE: 0.7 mg/dL (ref 0.61–1.24)
Chloride: 103 mmol/L (ref 101–111)
GFR calc Af Amer: >60 mL/min (ref >60)
GLUCOSE: 101 mg/dL — AB (ref 65–99)
Potassium: 3.7 mmol/L (ref 3.5–5.1)
Sodium: 138 mmol/L (ref 135–145)

## 2017-08-01 LAB — CBC WITH DIFFERENTIAL/PLATELET
Basophils Absolute: 0.1 10*3/uL (ref 0.0–0.1)
Basophils Relative: 1 %
EOS PCT: 6 %
Eosinophils Absolute: 0.5 10*3/uL (ref 0.0–0.7)
HCT: 45.7 % (ref 39.0–52.0)
Hemoglobin: 15.7 g/dL (ref 13.0–17.0)
LYMPHS PCT: 33 %
Lymphs Abs: 2.9 10*3/uL (ref 0.7–4.0)
MCH: 29.2 pg (ref 26.0–34.0)
MCHC: 34.4 g/dL (ref 30.0–36.0)
MCV: 85.1 fL (ref 78.0–100.0)
MONO ABS: 0.8 10*3/uL (ref 0.1–1.0)
MONOS PCT: 9 %
Neutro Abs: 4.6 10*3/uL (ref 1.7–7.7)
Neutrophils Relative %: 51 %
PLATELETS: 361 10*3/uL (ref 150–400)
RBC: 5.37 MIL/uL (ref 4.22–5.81)
RDW: 12.6 % (ref 11.5–15.5)
WBC: 8.9 10*3/uL (ref 4.0–10.5)

## 2017-08-01 MED ORDER — MECLIZINE HCL 25 MG PO TABS: 25.0000 mg | ORAL_TABLET | Freq: Three times a day (TID) | ORAL | 0 refills | Status: DC | PRN

## 2017-08-01 MED ORDER — MECLIZINE HCL 25 MG PO TABS
25.0000 mg | ORAL_TABLET | Freq: Once | ORAL | Status: AC
  Administered 2017-08-01: 25 mg via ORAL
  Filled 2017-08-01: qty 1

## 2017-08-01 MED ORDER — ACETAMINOPHEN 500 MG PO TABS
1000.0000 mg | ORAL_TABLET | Freq: Once | ORAL | Status: AC
  Administered 2017-08-01: 1000 mg via ORAL
  Filled 2017-08-01: qty 2

## 2017-08-01 NOTE — ED Provider Notes (Signed)
[facility] EMERGENCY DEPARTMENT Provider Note   CSN: [id number] Arrival date & time: 08/01/17  2002     History   Chief Complaint Chief Complaint  Patient presents with  . Dizziness    HPI Kyle Robertson is a 31 y.o. male.  HPI   Presents with dizziness, headache.  Felt clammy.  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 Was standing, and began to feel lightheaded.  In the past 30 minutes feeling better. Felt near to passing out.  Now feels lightheaded.  Was also having room spinning dizziness, when sitting still it is improved but moving head makes severe room spinning. Received meclizine in waiting room and feeling some improvement now.  No vomiting, has felt nausea.  No shortness of breath or chest pain.  No fevers.  No ear pain or ringing. Reports occasional tinnitus, not today though.  No cough, but does report some sinus congestion.  Headache has improved to 2/10, earlier was 5/10.  Mom reports family history of vertigo in her and other family members.   Past Medical History:  Diagnosis Date  . Sleep apnea     There are no active problems to display for this patient.   History reviewed. No pertinent surgical history.     Home Medications    Prior to Admission medications   Medication Sig Start Date End Date Taking? Authorizing Provider  meclizine (ANTIVERT) 25 MG tablet Take 1 tablet (25 mg total) by mouth 3 (three) times daily as needed for dizziness. 08/01/17   Alvira MondaySchlossman, Yamil Oelke, MD    Family History No family history on file.  Social History Social History   Tobacco Use  . Smoking status: Former Games developermoker  . Smokeless tobacco: Never Used  Substance Use Topics  . Alcohol use: Yes    Comment: occ  . Drug use: No     Allergies   Sudafed [pseudoephedrine hcl]   Review of Systems Review of Systems  Constitutional: Negative for fever.  HENT: Positive for congestion. Negative for sore throat.   Eyes: Negative for visual disturbance.    Respiratory: Negative for shortness of breath.   Cardiovascular: Negative for chest pain.  Gastrointestinal: Positive for nausea. Negative for abdominal pain, diarrhea and vomiting.  Genitourinary: Negative for difficulty urinating.  Musculoskeletal: Negative for back pain and neck stiffness.  Skin: Negative for rash.  Neurological: Positive for dizziness, light-headedness and headaches. Negative for syncope, weakness and numbness.     Physical Exam Updated Vital Signs BP (!) 123/57   Pulse 65   Temp 98.2 F (36.8 C)   Resp 18   Ht 6' (1.829 m)   Wt 95.3 kg (210 lb)   SpO2 99%   BMI 28.48 kg/m   Physical Exam  Constitutional: He is oriented to person, place, and time. He appears well-developed and well-nourished. No distress.  HENT:  Head: Normocephalic and atraumatic.  Eyes: Conjunctivae and EOM are normal.  Neck: Normal range of motion.  Cardiovascular: Normal rate, regular rhythm, normal heart sounds and intact distal pulses. Exam reveals no gallop and no friction rub.  No murmur heard. Pulmonary/Chest: Effort normal and breath sounds normal. No respiratory distress. He has no wheezes. He has no rales.  Abdominal: Soft. He exhibits no distension. There is no tenderness. There is no guarding.  Musculoskeletal: He exhibits no edema.  Neurological: He is alert and oriented to person, place, and time. He has normal strength. No cranial nerve deficit or sensory deficit. He displays a negative Romberg sign.  Coordination and gait normal. GCS eye subscore is 4. GCS verbal subscore is 5. GCS motor subscore is 6.  Skin: Skin is warm and dry. He is not diaphoretic.  Nursing note and vitals reviewed.    ED Treatments / Results  Labs (all labs ordered are listed, but only abnormal results are displayed) Labs Reviewed  BASIC METABOLIC PANEL - Abnormal; Notable for the following components:      Result Value   Glucose, Bld 101 (*)    All other components within normal limits   CBC WITH DIFFERENTIAL/PLATELET    EKG  EKG Interpretation  Date/Time:  Friday August 01 2017 20:11:34 EST Ventricular Rate:  77 PR Interval:  150 QRS Duration: 98 QT Interval:  372 QTC Calculation: 420 R Axis:   27 Text Interpretation:  Normal sinus rhythm Normal ECG No significant change since last tracing Confirmed by Alvira MondaySchlossman, [place] Pettengill ([id number]) on 08/01/2017 10:27:34 PM       Radiology Ct Head Wo Contrast  Result Date: 08/01/2017 CLINICAL DATA:  Dizziness and numbness EXAM: CT HEAD WITHOUT CONTRAST TECHNIQUE: Contiguous axial images were obtained from the base of the skull through the vertex without intravenous contrast. COMPARISON:  None. FINDINGS: Brain: No evidence of acute infarction, hemorrhage, hydrocephalus, extra-axial collection or mass lesion/mass effect. Vascular: No hyperdense vessel or unexpected calcification. Skull: Normal. Negative for fracture or focal lesion. Sinuses/Orbits: No acute finding. Other: None. IMPRESSION: Negative non contrasted CT examination of the brain. Electronically Signed   By: Jasmine PangKim  Fujinaga M.D.   On: 08/01/2017 20:53    Procedures Procedures (including critical care time)  Medications Ordered in ED Medications  meclizine (ANTIVERT) tablet 25 mg (25 mg Oral Given 08/01/17 2032)  acetaminophen (TYLENOL) tablet 1,000 mg (1,000 mg Oral Given 08/01/17 2032)  meclizine (ANTIVERT) tablet 25 mg (25 mg Oral Given 08/01/17 2246)     Initial Impression / Assessment and Plan / ED Course  I have reviewed the triage vital signs and the nursing notes.  Pertinent labs & imaging results that were available during my care of the patient were reviewed by me and considered in my medical decision making (see chart for details).     31yo male presents with concern for headache, dizziness and nausea.  Given headache and dizziness, CT head ordered and shows no acute abnormalities.  Differential diagnosis for dizziness includes central causes such as  stroke, intracranial bleed, mass and peripheral causes such as BPPV, meniere's disease, viral.  Vertigo is positional, patient has normal neurologic exam, including normal gait and coordination, and no risk factors of stroke and have low suspicion for CVA or other central cause of vertigo.  He was given meclizine given triage description of positional vertigo, and at time of my evaluation feels significant improvement, is able to ambulate, sit up. Mild continuing vertigo will treat with additional meclizine.  Given rx for meclizine, recommend PCP follow up. Patient discharged in stable condition with understanding of reasons to return.   Final Clinical Impressions(s) / ED Diagnoses   Final diagnoses:  Dizziness  Vertigo    ED Discharge Orders        Ordered    meclizine (ANTIVERT) 25 MG tablet  3 times daily PRN     08/01/17 2240       Alvira MondaySchlossman, Delania Ferg, MD 08/02/17 1230

## 2017-08-01 NOTE — ED Notes (Addendum)
Previous note at this time entered in error.

## 2017-08-01 NOTE — ED Triage Notes (Addendum)
C/o sudden onset dizziness, HA while at work approx 6pm-presents to triage in w/c-dizziness worse with head movement and with ambulation-presents to triage in w/c-pt with verbal c/o of increase in dizziness when moved in w/c

## 2017-08-01 NOTE — ED Notes (Signed)
Discussed case with EDP-orders received  

## 2017-12-22 IMAGING — CT CT HEAD W/O CM
3 series · 16 of 47 positions shown, 19 images · non-contrast
Comparison: None.

CLINICAL DATA: Dizziness and numbness

EXAM:
CT HEAD WITHOUT CONTRAST
TECHNIQUE: Contiguous axial images were obtained from the base of the skull
through the vertex without intravenous contrast.

[Series 2: head wo · axial · 0.47mm/px · z∈[+265,+410]mm · 10 of 35 slices shown, 13 images]
[im 3/35  brain]
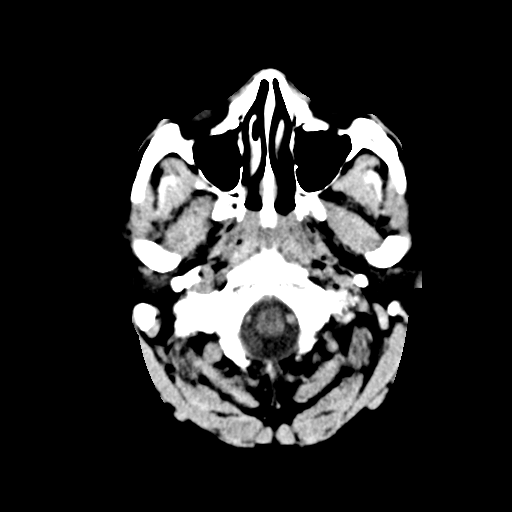
[im 3/35  bone]
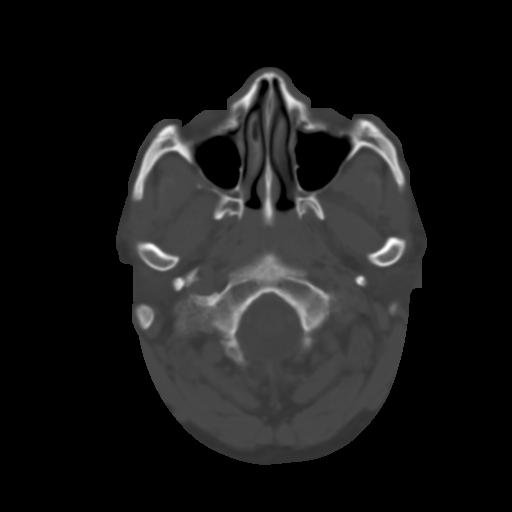
[im 6/35  brain]
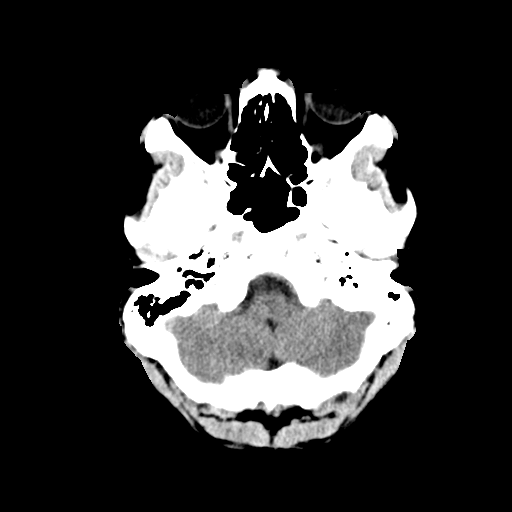
[im 10/35  brain]
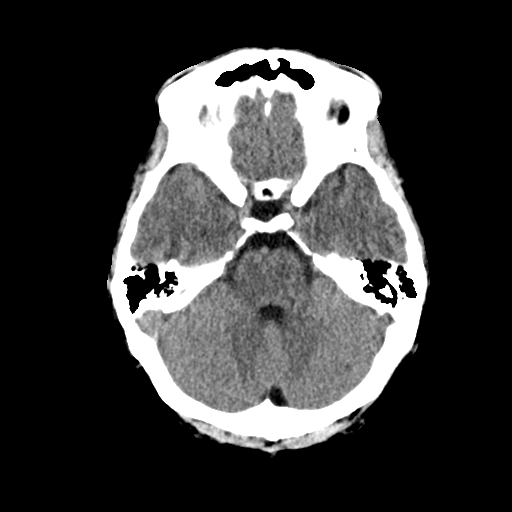
[im 12/35  brain]
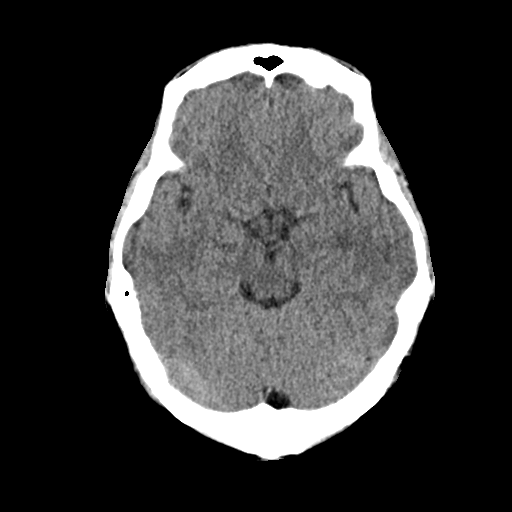
[im 16/35  brain]
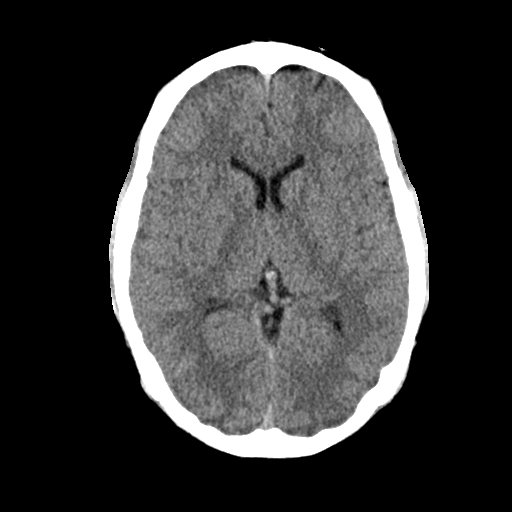
[im 16/35  bone]
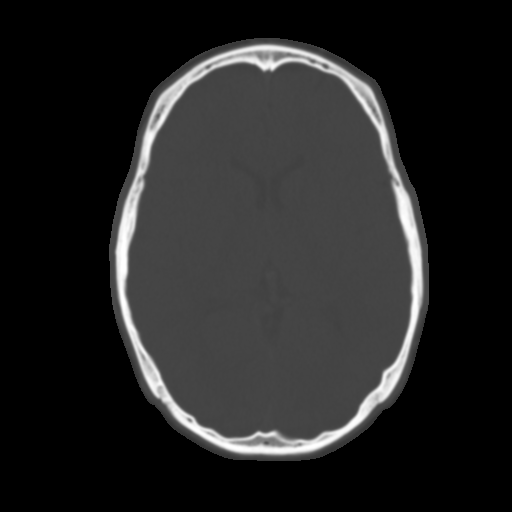
[im 19/35  brain]
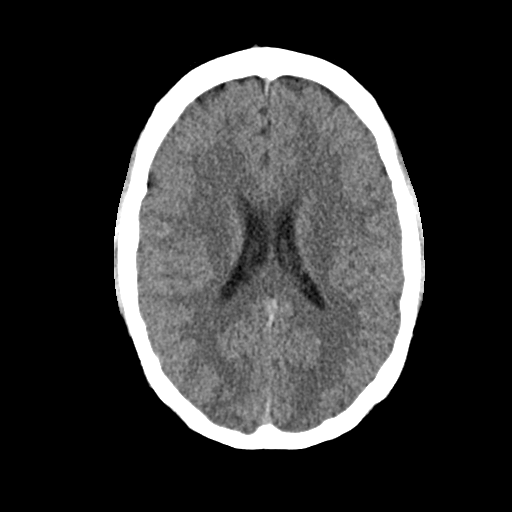
[im 23/35  brain]
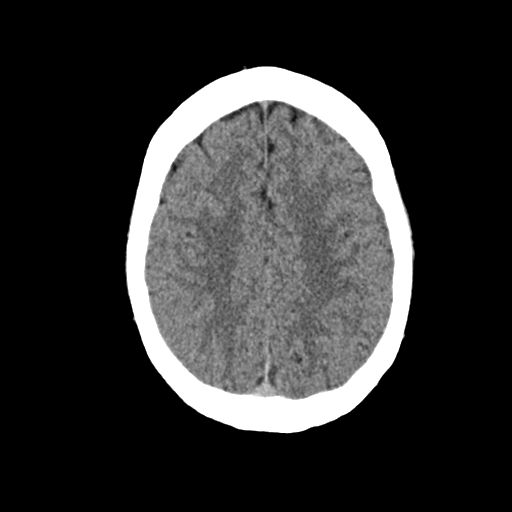
[im 26/35  brain]
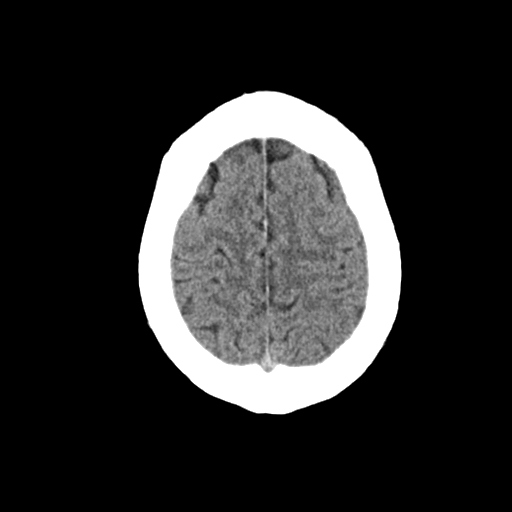
[im 29/35  brain]
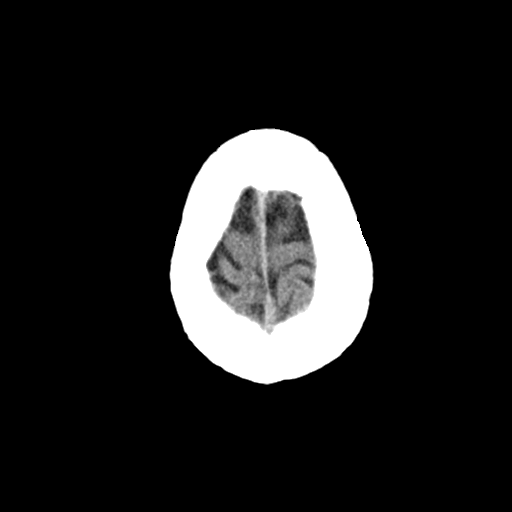
[im 29/35  bone]
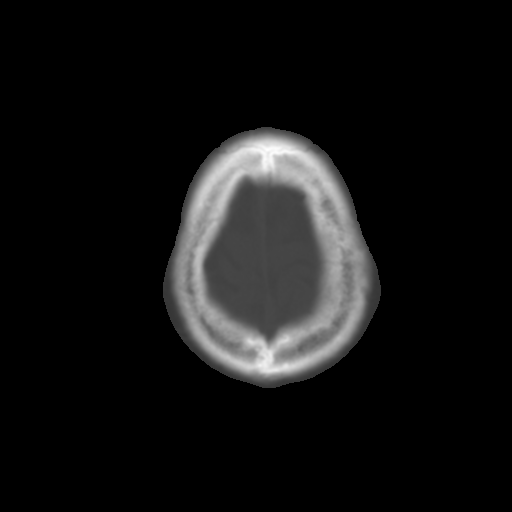
[im 32/35  brain]
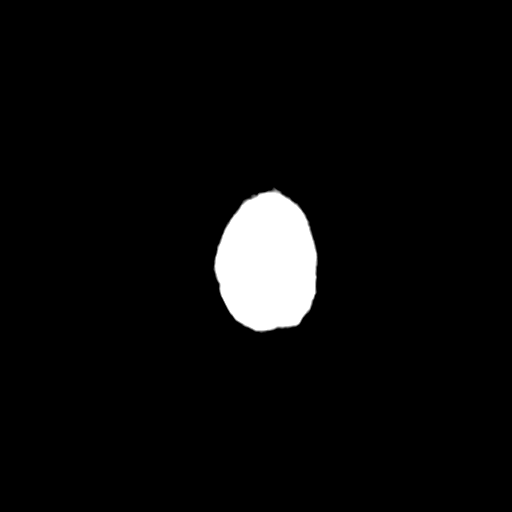

[Series 4: coronal soft · coronal · 0.36mm/px · 3 of 73 slices shown]
[im 25/73  brain]
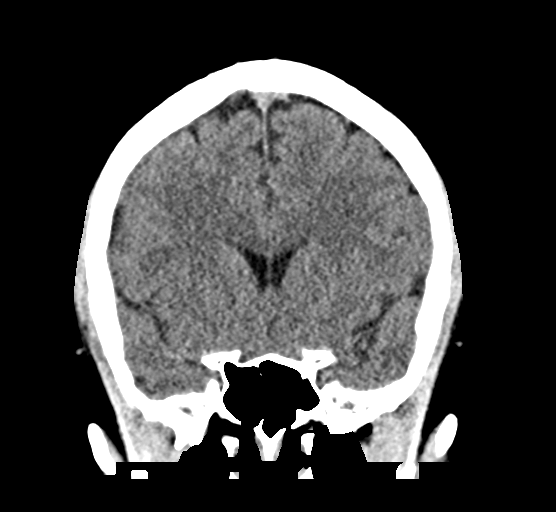
[im 33/73  brain]
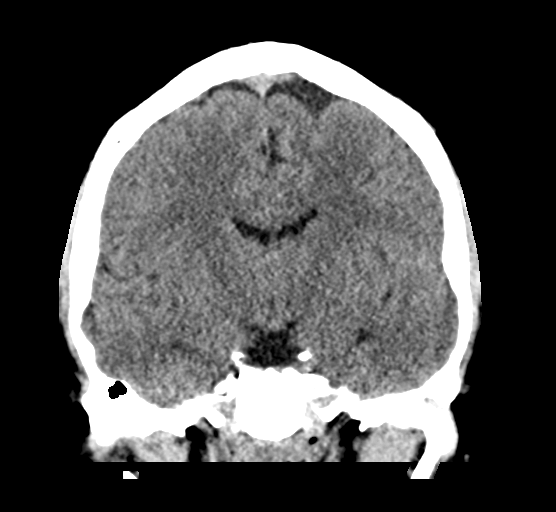
[im 41/73  brain]
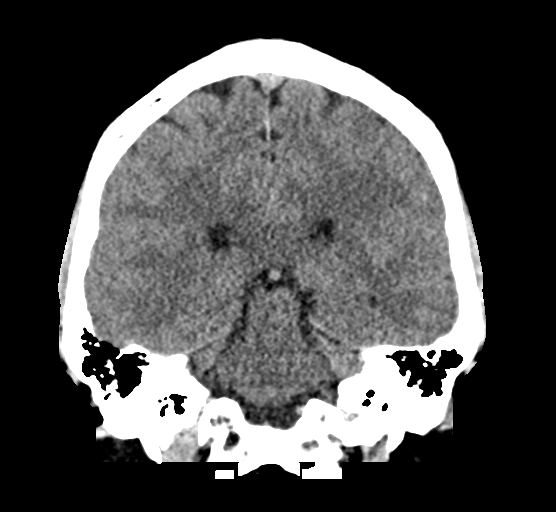

[Series 5: sag soft · sagittal · 0.36mm/px · 3 of 58 slices shown]
[im 20/58  brain]
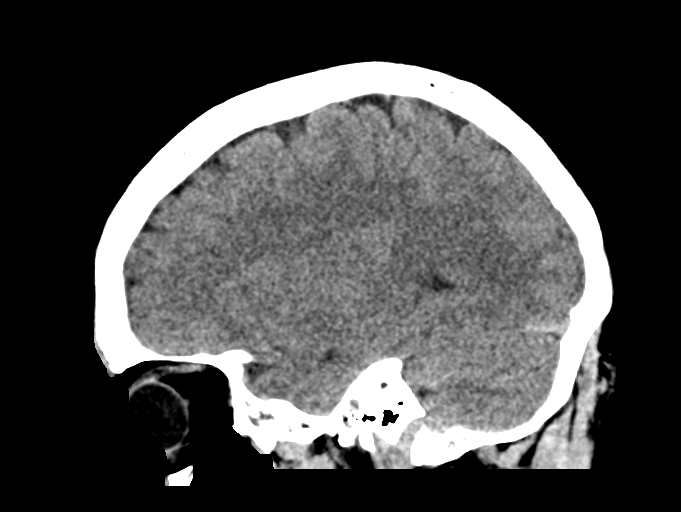
[im 29/58  brain]
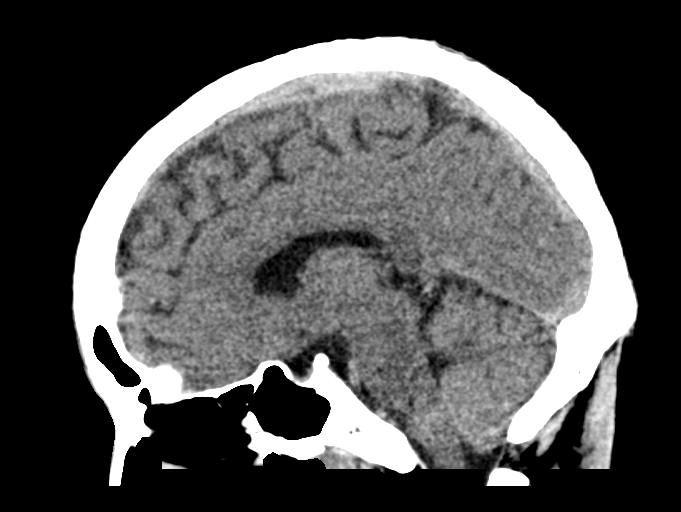
[im 39/58  brain]
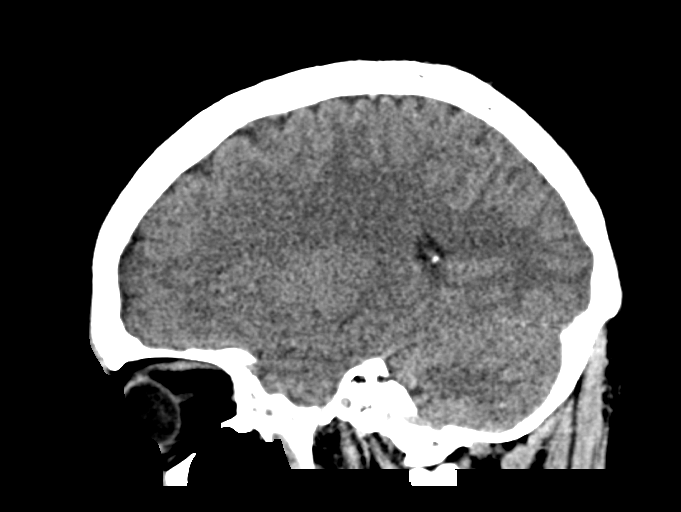

[16 of 47 positions shown; findings below may reference images not displayed]

FINDINGS: Brain: No evidence of acute infarction, hemorrhage, hydrocephalus,
extra-axial collection or mass lesion/mass effect.

Vascular: No hyperdense vessel or unexpected calcification.

Skull: Normal. Negative for fracture or focal lesion.

Sinuses/Orbits: No acute finding.

Other: None.
IMPRESSION: Negative non contrasted CT examination of the brain.

## 2020-01-18 ENCOUNTER — Encounter (HOSPITAL_BASED_OUTPATIENT_CLINIC_OR_DEPARTMENT_OTHER): Payer: Self-pay | Admitting: *Deleted

## 2020-01-18 ENCOUNTER — Emergency Department (HOSPITAL_BASED_OUTPATIENT_CLINIC_OR_DEPARTMENT_OTHER)
Admission: EM | Admit: 2020-01-18 | Discharge: 2020-01-18 | Disposition: A | Discharge status: Home / Self Care | Payer: Medicaid Other | Attending: Emergency Medicine | Admitting: Emergency Medicine

## 2020-01-18 ENCOUNTER — Other Ambulatory Visit: Payer: Self-pay

## 2020-01-18 DIAGNOSIS — S81852A Open bite, left lower leg, initial encounter: Secondary | ICD-10-CM | POA: Insufficient documentation

## 2020-01-18 DIAGNOSIS — Z888 Allergy status to other drugs, medicaments and biological substances status: Secondary | ICD-10-CM | POA: Diagnosis not present

## 2020-01-18 DIAGNOSIS — Y990 Civilian activity done for income or pay: Secondary | ICD-10-CM | POA: Diagnosis not present

## 2020-01-18 DIAGNOSIS — Z79899 Other long term (current) drug therapy: Secondary | ICD-10-CM | POA: Diagnosis not present

## 2020-01-18 DIAGNOSIS — Z87891 Personal history of nicotine dependence: Secondary | ICD-10-CM | POA: Insufficient documentation

## 2020-01-18 DIAGNOSIS — Y929 Unspecified place or not applicable: Secondary | ICD-10-CM | POA: Insufficient documentation

## 2020-01-18 DIAGNOSIS — Y939 Activity, unspecified: Secondary | ICD-10-CM | POA: Diagnosis not present

## 2020-01-18 DIAGNOSIS — W540XXA Bitten by dog, initial encounter: Secondary | ICD-10-CM | POA: Diagnosis not present

## 2020-01-18 DIAGNOSIS — Z23 Encounter for immunization: Secondary | ICD-10-CM | POA: Diagnosis not present

## 2020-01-18 MED ORDER — TETANUS-DIPHTH-ACELL PERTUSSIS 5-2.5-18.5 LF-MCG/0.5 IM SUSP
0.5000 mL | Freq: Once | INTRAMUSCULAR | Status: AC
  Administered 2020-01-18: 0.5 mL via INTRAMUSCULAR
  Filled 2020-01-18: qty 0.5

## 2020-01-18 MED ORDER — AMOXICILLIN-POT CLAVULANATE 875-125 MG PO TABS: 1.0000 | ORAL_TABLET | Freq: Two times a day (BID) | ORAL | 0 refills | Status: DC

## 2020-01-18 NOTE — ED Provider Notes (Signed)
[facility] EMERGENCY DEPARTMENT Provider Note   CSN: [id number] Arrival date & time: 01/18/20  5974     History Chief Complaint  Patient presents with  . Dog Bite    Kyle Robertson is a 34 y.o. male with no relevant past medical history presents the ED after sustaining a dog bite.  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  Patient denies any discomfort, but states that his employer encouraged him to come to the ED for evaluation that he would likely need tetanus prophylaxis within 24 hours.  He states that there is already animal control scheduled to go to the client's home.  While there was no puncture wound, patient does believe that it may have broken the skin.  He states that it never bled.  Patient is unsure of his last tetanus vaccination.  He denies any difficulty ambulating, numbness or weakness, fevers or chills, redness or swelling, or other symptoms.  HPI     Past Medical History:  Diagnosis Date  . Sleep apnea     There are no problems to display for this patient.   History reviewed. No pertinent surgical history.     No family history on file.  Social History   Tobacco Use  . Smoking status: Former Smoker    Quit date: 2019    Years since quitting: 2.4  . Smokeless tobacco: Never Used  Substance Use Topics  . Alcohol use: Yes    Comment: occ  . Drug use: No    Home Medications Prior to Admission medications   Medication Sig Start Date End Date Taking? Authorizing Provider  divalproex (DEPAKOTE SPRINKLE) 125 MG capsule Take 300 mg by mouth daily.   Yes [provider]  amoxicillin-clavulanate (AUGMENTIN) 875-125 MG tablet Take 1 tablet by mouth every 12 (twelve) hours. 01/18/20   Corena Herter, PA-C    Allergies    Sudafed [pseudoephedrine hcl]  Review of Systems     Review of Systems  Constitutional: Negative for fever.  Skin: Positive for wound.  Neurological: Negative for weakness and numbness.    Physical Exam Updated Vital Signs BP 135/73   Pulse 61   Temp 98.1 F (36.7 C) (Oral)   Resp 16   Ht 6' (1.829 m)   Wt 98.8 kg   SpO2 98%   BMI 29.54 kg/m   Physical Exam Vitals and nursing note reviewed. Exam conducted with a chaperone present.  Constitutional:      Appearance: Normal appearance.  HENT:     Head: Normocephalic and atraumatic.  Eyes:     General: No scleral icterus.    Conjunctiva/sclera: Conjunctivae normal.  Cardiovascular:     Rate and Rhythm: Normal rate and regular rhythm.     Pulses: Normal pulses.     Heart sounds: Normal heart sounds.  Pulmonary:     Effort: Pulmonary effort is normal. No respiratory distress.     Breath sounds: Normal breath sounds.  Musculoskeletal:     Cervical back: Normal range of motion. No rigidity.     Comments: Left leg: Superficial abrasion to medial aspect left lower leg.  No evidence of puncture.  Nonbleeding.  No numbness or weakness.  Pedal pulse intact.  Soft compartments.  No induration, erythema, or warmth.  Sensation intact throughout.  Skin:  General: Skin is dry.     Capillary Refill: Capillary refill takes less than 2 seconds.  Neurological:     Mental Status: He is alert and oriented to person, place, and time.     GCS: GCS eye subscore is 4. GCS verbal subscore is 5. GCS motor subscore is 6.  Psychiatric:        Mood and Affect: Mood normal.        Behavior: Behavior normal.        Thought Content: Thought content normal.         ED Results / Procedures / Treatments   Labs (all labs ordered are listed, but only abnormal results are displayed) Labs Reviewed - No data to display  EKG None  Radiology No results found.  Procedures Procedures (including critical care time)  Medications Ordered in ED Medications  Tdap (BOOSTRIX) injection 0.5 mL (has  no administration in time range)    ED Course  I have reviewed the triage vital signs and the nursing notes.  Pertinent labs & imaging results that were available during my care of the patient were reviewed by me and considered in my medical decision making (see chart for details).    MDM Rules/Calculators/A&P                      Updated patient's tetanus here in the ED.  He plans to follow-up with animal control as they are scheduled to visit the home with a dog / speak with the owners in the next 24 hours.  The owner's dog will need to quarantine for 10 days to observe for any behavioral change.  Patient plans to communicate these instructions with his employer.  I will prescribe him a course of Augmentin.  However, encouraged him to hold off on filling the prescription unless he sees the area gets red, warm, swollen, painful, and/or other evidence of infection.  Currently there is no evidence to suggest that it is infected.  I have low suspicion for rabies transmission as there was no puncture or bleeding bite wound.  This was merely a scratch.  Patient denies any discomfort and the remainder of his physical exam is entirely benign.  I discussed this with Dr. Judd Lien who agrees with assessment and plan.  Final Clinical Impression(s) / ED Diagnoses Final diagnoses:  Bite from dog, initial encounter    Rx / DC Orders ED Discharge Orders         Ordered    amoxicillin-clavulanate (AUGMENTIN) 875-125 MG tablet  Every 12 hours     01/18/20 0944           Lorelee New, PA-C 01/18/20 0945    Geoffery Lyons, MD 01/18/20 1108

## 2020-01-18 NOTE — Discharge Instructions (Addendum)
I have prescribed you a course of Augmentin.  Please only fill this antibiotic prescription should you develop evidence of infection.  If your leg gets warm, painful, or red, these are signs of infection.  Or if you develop fevers/chills.  I would like for you to follow-up with your employer regarding today's encounter.  I would also like for them to reach out to animal control to expedite evaluation of the offending dog.  That dog will need to quarantine for 7 days to observe for any behavioral changes.  While I have low suspicion for transmission of rabies from this pet, particularly given your superficial abrasion, I agree that steps need to be taken to ensure that this dog is up-to-date on its shots.  Please follow-up with your primary care provider regarding today's encounter.  Return to the ED or seek immediate medical attention should you develop any new or worsening symptoms.

## 2020-01-18 NOTE — ED Notes (Signed)
ED Provider at bedside. 

## 2020-01-18 NOTE — ED Triage Notes (Signed)
Dog bite yesterday around 6:50 pm.

## 2020-06-20 ENCOUNTER — Encounter (HOSPITAL_BASED_OUTPATIENT_CLINIC_OR_DEPARTMENT_OTHER): Payer: Self-pay | Admitting: *Deleted

## 2020-06-20 ENCOUNTER — Other Ambulatory Visit: Payer: Self-pay

## 2020-06-20 ENCOUNTER — Emergency Department (HOSPITAL_BASED_OUTPATIENT_CLINIC_OR_DEPARTMENT_OTHER)
Admission: EM | Admit: 2020-06-20 | Discharge: 2020-06-21 | Disposition: A | Discharge status: Home / Self Care | Payer: Medicaid Other | Attending: Emergency Medicine | Admitting: Emergency Medicine

## 2020-06-20 ENCOUNTER — Emergency Department (HOSPITAL_BASED_OUTPATIENT_CLINIC_OR_DEPARTMENT_OTHER): Payer: Medicaid Other

## 2020-06-20 DIAGNOSIS — Z20822 Contact with and (suspected) exposure to covid-19: Secondary | ICD-10-CM | POA: Diagnosis not present

## 2020-06-20 DIAGNOSIS — Z87891 Personal history of nicotine dependence: Secondary | ICD-10-CM | POA: Diagnosis not present

## 2020-06-20 DIAGNOSIS — R0602 Shortness of breath: Secondary | ICD-10-CM | POA: Diagnosis not present

## 2020-06-20 DIAGNOSIS — F430 Acute stress reaction: Secondary | ICD-10-CM | POA: Insufficient documentation

## 2020-06-20 DIAGNOSIS — R079 Chest pain, unspecified: Secondary | ICD-10-CM | POA: Insufficient documentation

## 2020-06-20 DIAGNOSIS — F410 Panic disorder [episodic paroxysmal anxiety] without agoraphobia: Secondary | ICD-10-CM | POA: Diagnosis not present

## 2020-06-20 DIAGNOSIS — R42 Dizziness and giddiness: Secondary | ICD-10-CM | POA: Diagnosis present

## 2020-06-20 HISTORY — DX: Panic disorder (episodic paroxysmal anxiety): F41.0

## 2020-06-20 HISTORY — DX: Dizziness and giddiness: R42

## 2020-06-20 LAB — TROPONIN I (HIGH SENSITIVITY)
Troponin I (High Sensitivity): 4 ng/L (ref <18)
Troponin I (High Sensitivity): 4 ng/L (ref <18)

## 2020-06-20 LAB — BASIC METABOLIC PANEL
Anion gap: 11 (ref 5–15)
BUN: 11 mg/dL (ref 6–20)
CO2: 25 mmol/L (ref 22–32)
Calcium: 9 mg/dL (ref 8.9–10.3)
Chloride: 102 mmol/L (ref 98–111)
Creatinine, Ser: 0.81 mg/dL (ref 0.61–1.24)
GFR, Estimated: >60 mL/min (ref >60)
Glucose, Bld: 150 mg/dL — ABNORMAL HIGH (ref 70–99)
Potassium: 3.7 mmol/L (ref 3.5–5.1)
Sodium: 138 mmol/L (ref 135–145)

## 2020-06-20 LAB — CBC
HCT: 44.2 % (ref 39.0–52.0)
Hemoglobin: 15 g/dL (ref 13.0–17.0)
MCH: 29.5 pg (ref 26.0–34.0)
MCHC: 33.9 g/dL (ref 30.0–36.0)
MCV: 87 fL (ref 80.0–100.0)
Platelets: 352 10*3/uL (ref 150–400)
RBC: 5.08 MIL/uL (ref 4.22–5.81)
RDW: 11.9 % (ref 11.5–15.5)
WBC: 7.8 10*3/uL (ref 4.0–10.5)
nRBC: 0 % (ref 0.0–0.2)

## 2020-06-20 LAB — RESPIRATORY PANEL BY RT PCR (FLU A&B, COVID)
Influenza A by PCR: NEGATIVE
Influenza B by PCR: NEGATIVE
SARS Coronavirus 2 by RT PCR: NEGATIVE

## 2020-06-20 LAB — GROUP A STREP BY PCR: Group A Strep by PCR: NOT DETECTED

## 2020-06-20 MED ORDER — ONDANSETRON HCL 4 MG/2ML IJ SOLN
4.0000 mg | Freq: Once | INTRAMUSCULAR | Status: AC
  Administered 2020-06-20: 4 mg via INTRAVENOUS
  Filled 2020-06-20: qty 2

## 2020-06-20 MED ORDER — DIPHENHYDRAMINE HCL 50 MG/ML IJ SOLN
25.0000 mg | Freq: Once | INTRAMUSCULAR | Status: AC
Start: 2020-06-20 — End: 2020-06-20
  Administered 2020-06-20: 25 mg via INTRAVENOUS
  Filled 2020-06-20: qty 1

## 2020-06-20 NOTE — ED Notes (Signed)
Pt placed in a gown and hooked up to the monitor with the 5 lead, BP cuff and pulse ox 

## 2020-06-20 NOTE — ED Provider Notes (Signed)
[facility] Provider Note: Lowella Dell, MD, FACEP  CSN: [id number] MRN: [id number] ARRIVAL: 06/20/20 at 1845 ROOM: [facility]   CHIEF COMPLAINT  Dizziness   HISTORY OF PRESENT ILLNESS  06/20/20 10:54 PM Kyle Robertson is a 34 y.o. male who developed the sudden onset of dizziness (by which he means a sensation of surroundings moving and being off balance) and nausea about 5 PM.  Symptoms were worse with movement of his head and improved with lying still.  He has also had a sensation of being generally weak.  While driving to an urgent care he started having chest pain (6 out of 10), shortness of breath and worsening malaise.  Symptoms were similar to previous panic attacks which he has only infrequently.  Most of the symptoms have resolved but his dizziness persists, especially if he moves.   Past Medical History:  Diagnosis Date  . Panic attack   . Sleep apnea   . Vertigo     History reviewed. No pertinent surgical history.  History reviewed. No pertinent family history.  Social History   Tobacco Use  . Smoking status: Former Smoker    Quit date: 2019    Years since quitting: 2.8  . Smokeless tobacco: Never Used  Substance Use Topics  . Alcohol use: Yes    Comment: occ  . Drug use: No    Prior to Admission medications   Medication Sig Start Date End Date Taking? Authorizing Provider  divalproex (DEPAKOTE SPRINKLE) 125 MG capsule Take 300 mg by mouth daily.    [provider]  meclizine (ANTIVERT) 25 MG tablet Take 1 tablet (25 mg total) by mouth 3 (three) times daily as needed for dizziness. 06/21/20   Cordelle Dahmen, MD  ondansetron (ZOFRAN ODT) 8 MG disintegrating tablet Take 1 tablet (8 mg total) by mouth every 8 (eight) hours as needed for nausea or vomiting. 06/21/20   Diallo Ponder, MD    Allergies Sudafed [pseudoephedrine hcl]   REVIEW OF SYSTEMS  Negative except as noted here or in the History of Present Illness.   PHYSICAL  EXAMINATION  Initial Vital Signs Blood pressure (!) 144/86, pulse (!) 50, temperature 97.6 F (36.4 C), temperature source Oral, resp. rate 17, height 6' (1.829 m), weight 99.8 kg, SpO2 100 %.  Examination General: Well-developed, well-nourished male in no acute distress; appearance consistent with age of record HENT: normocephalic; atraumatic; TMs normal Eyes: pupils equal, round and reactive to light; extraocular muscles intact; no nystagmus Neck: supple Heart: regular rate and rhythms or gallops Lungs: clear to auscultation bilaterally Abdomen: soft; nondistended; nontender; bowel sounds present Extremities: No deformity; full range of motion; pulses normal Neurologic: Awake, alert and oriented; motor function intact in all extremities and symmetric; no facial droop Skin: Warm and dry Psychiatric: Normal mood and affect   RESULTS  Summary of this visit's results, reviewed and interpreted by myself:   EKG Interpretation  Date/Time:  Tuesday June 20 2020 18:57:23 EST Ventricular Rate:  63 PR Interval:  154 QRS Duration: 106 QT Interval:  450 QTC Calculation: 460 R Axis:   69 Text Interpretation: Normal sinus rhythm Normal ECG Confirmed by Tilden Fossa [phone]) on 06/20/2020 9:19:17 PM      Laboratory Studies: Results for orders placed or performed during the hospital encounter of 06/20/20 (from the past 24 hour(s))  Group A Strep by PCR     Status: None   Collection Time: 06/20/20  7:06 PM   Specimen: Throat; Sterile Swab  Result  Value Ref Range   Group A Strep by PCR NOT DETECTED NOT DETECTED  Respiratory Panel by RT PCR (Flu A&B, Covid) - Nasopharyngeal Swab     Status: None   Collection Time: 06/20/20  7:06 PM   Specimen: Nasopharyngeal Swab  Result Value Ref Range   SARS Coronavirus 2 by RT PCR NEGATIVE NEGATIVE   Influenza A by PCR NEGATIVE NEGATIVE   Influenza B by PCR NEGATIVE NEGATIVE  Basic metabolic panel     Status: Abnormal   Collection Time:  06/20/20  7:10 PM  Result Value Ref Range   Sodium 138 135 - 145 mmol/L   Potassium 3.7 3.5 - 5.1 mmol/L   Chloride 102 98 - 111 mmol/L   CO2 25 22 - 32 mmol/L   Glucose, Bld 150 (H) 70 - 99 mg/dL   BUN 11 6 - 20 mg/dL   Creatinine, Ser 3.81 0.61 - 1.24 mg/dL   Calcium 9.0 8.9 - 82.9 mg/dL   GFR, Estimated >93 >71 mL/min   Anion gap 11 5 - 15  CBC     Status: None   Collection Time: 06/20/20  7:10 PM  Result Value Ref Range   WBC 7.8 4.0 - 10.5 K/uL   RBC 5.08 4.22 - 5.81 MIL/uL   Hemoglobin 15.0 13.0 - 17.0 g/dL   HCT 69.6 39 - 52 %   MCV 87.0 80.0 - 100.0 fL   MCH 29.5 26.0 - 34.0 pg   MCHC 33.9 30.0 - 36.0 g/dL   RDW 78.9 38.1 - 01.7 %   Platelets 352 150 - 400 K/uL   nRBC 0.0 0.0 - 0.2 %  Troponin I (High Sensitivity)     Status: None   Collection Time: 06/20/20  7:10 PM  Result Value Ref Range   Troponin I (High Sensitivity) 4 <18 ng/L  Troponin I (High Sensitivity)     Status: None   Collection Time: 06/20/20  9:33 PM  Result Value Ref Range   Troponin I (High Sensitivity) 4 <18 ng/L   Imaging Studies: DG Chest 2 View  Result Date: 06/20/2020 CLINICAL DATA:  Chest pain. EXAM: CHEST - 2 VIEW COMPARISON:  November 06, 2015. FINDINGS: The heart size and mediastinal contours are within normal limits. Both lungs are clear. No pneumothorax or pleural effusion is noted. The visualized skeletal structures are unremarkable. IMPRESSION: No active cardiopulmonary disease. Electronically Signed   By: Lupita Raider M.D.   On: 06/20/2020 19:36    ED COURSE and MDM  Nursing notes, initial and subsequent vitals signs, including pulse oximetry, reviewed and interpreted by myself.  Vitals:   06/20/20 2135 06/20/20 2230 06/20/20 2330 06/21/20 0000  BP: (!) 157/95 (!) 144/86 135/80 (!) 146/81  Pulse: (!) 59 (!) 50 (!) 49 (!) 55  Resp: 12 17 18 17   Temp:      TempSrc:      SpO2: 99% 100% 100% 100%  Weight:      Height:       Medications  ondansetron (ZOFRAN) injection 4 mg (4  mg Intravenous Given 06/20/20 2312)  diphenhydrAMINE (BENADRYL) injection 25 mg (25 mg Intravenous Given 06/20/20 2312)   12:23 AM Significant relief after IV Zofran and Benadryl.  Presentation consistent with panic attack brought on by episode of vertigo.  Patient has a history of both.   PROCEDURES  Procedures   ED DIAGNOSES     ICD-10-CM   1. Vertigo  R42   2. Panic attack as reaction to  stress  F41.0    F43.0        Zarie Kosiba, Jonny Ruiz, MD 06/21/20 0025

## 2020-06-20 NOTE — ED Triage Notes (Signed)
Pt reports SOB, nasal congestion, nausea, sore throat, chest pain x several hours.

## 2020-06-21 MED ORDER — ONDANSETRON 8 MG PO TBDP: 8.0000 mg | ORAL_TABLET | Freq: Three times a day (TID) | ORAL | 0 refills | Status: AC | PRN

## 2020-06-21 MED ORDER — MECLIZINE HCL 25 MG PO TABS: 25.0000 mg | ORAL_TABLET | Freq: Three times a day (TID) | ORAL | 0 refills | Status: AC | PRN

## 2020-06-21 NOTE — ED Notes (Signed)
Pt endorses feeling much better.

## 2020-06-21 NOTE — ED Notes (Signed)
Discharge instructions discussed. Verbalized understanding of medications and follow up care. Departs ED at this time in stable condition.

## 2020-11-10 IMAGING — DX DG CHEST 2V
2 series · 2 of 2 positions shown · non-contrast
Comparison: November 06, 2015.

CLINICAL DATA: Chest pain.

EXAM:
CHEST - 2 VIEW

[chest pa]
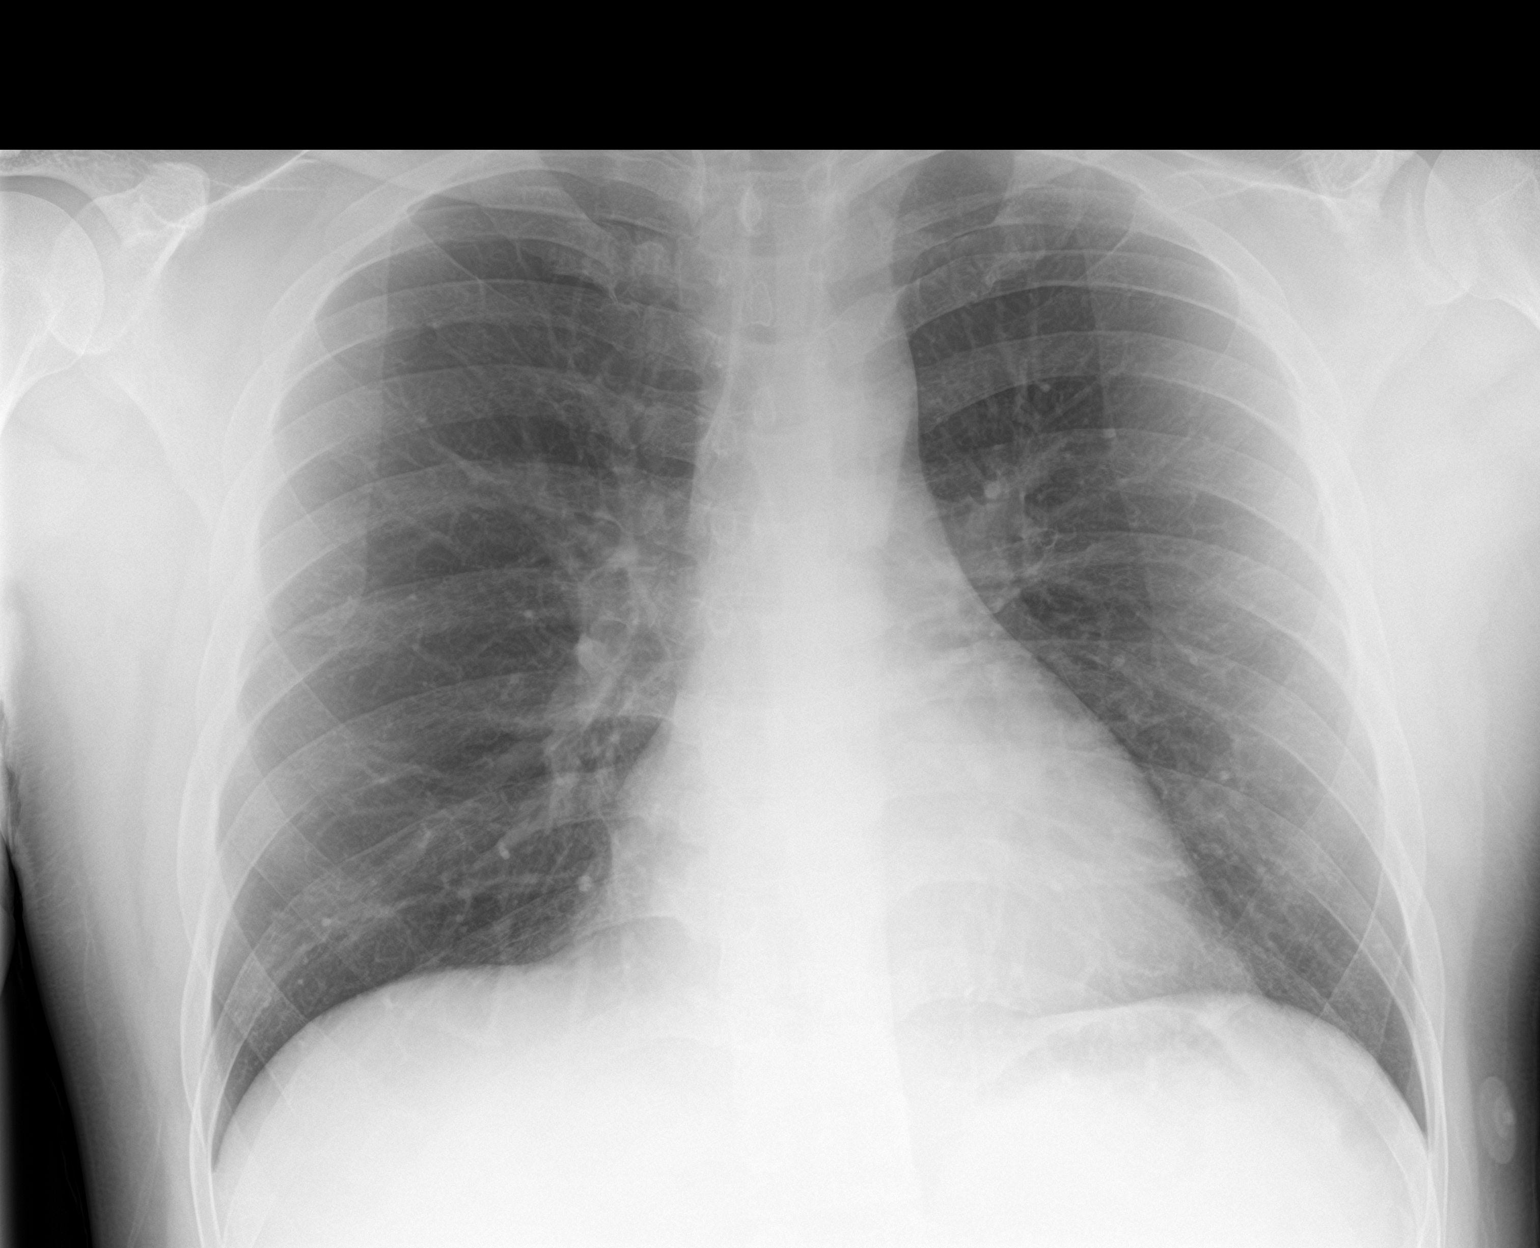

[chest lat]
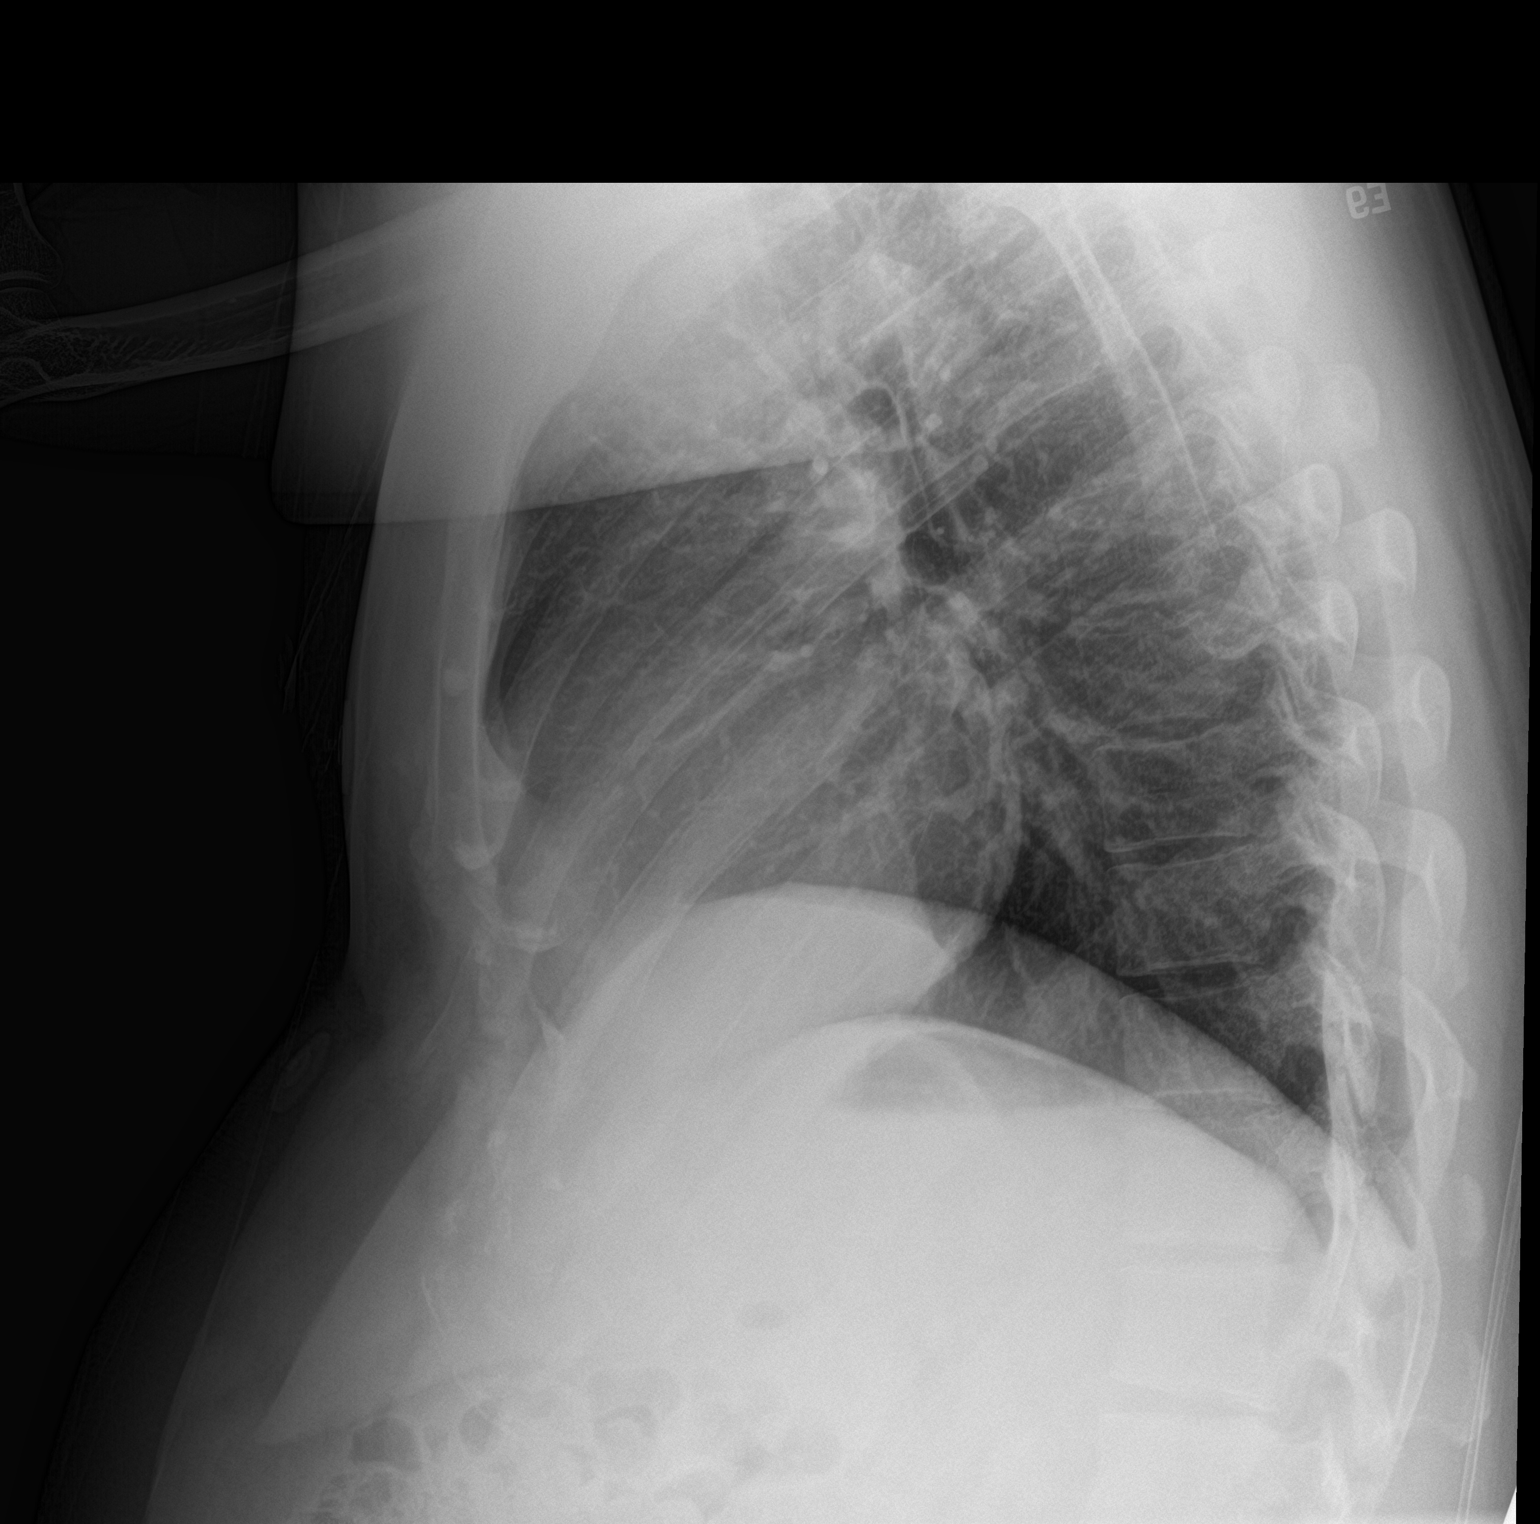

[2 of 2 positions shown; findings below may reference images not displayed]

FINDINGS: The heart size and mediastinal contours are within normal limits.
Both lungs are clear. No pneumothorax or pleural effusion is noted.
The visualized skeletal structures are unremarkable.
IMPRESSION: No active cardiopulmonary disease.
